# Patient Record
Sex: Male | Born: 1975
Health system: Southern US, Community
[De-identification: ages and names within clinical notes are randomized; demographics above are authoritative.]

## PROBLEM LIST (undated history)

## (undated) DIAGNOSIS — E119 Type 2 diabetes mellitus without complications: Secondary | ICD-10-CM

## (undated) DIAGNOSIS — I10 Essential (primary) hypertension: Secondary | ICD-10-CM

---

## 2004-03-29 ENCOUNTER — Emergency Department (HOSPITAL_COMMUNITY): Admission: EM | Admit: 2004-03-29 | Discharge: 2004-03-29 | Payer: Self-pay | Admitting: Emergency Medicine

## 2013-11-20 ENCOUNTER — Emergency Department (HOSPITAL_COMMUNITY): Payer: Self-pay

## 2013-11-20 ENCOUNTER — Encounter (HOSPITAL_COMMUNITY): Payer: Self-pay | Admitting: Emergency Medicine

## 2013-11-20 ENCOUNTER — Emergency Department (HOSPITAL_COMMUNITY)
Admission: EM | Admit: 2013-11-20 | Discharge: 2013-11-20 | Disposition: A | Discharge status: Home / Self Care | Payer: Self-pay | Attending: Emergency Medicine | Admitting: Emergency Medicine

## 2013-11-20 DIAGNOSIS — S21239A Puncture wound without foreign body of unspecified back wall of thorax without penetration into thoracic cavity, initial encounter: Secondary | ICD-10-CM

## 2013-11-20 DIAGNOSIS — R000 Tachycardia, unspecified: Secondary | ICD-10-CM | POA: Insufficient documentation

## 2013-11-20 DIAGNOSIS — Z23 Encounter for immunization: Secondary | ICD-10-CM | POA: Insufficient documentation

## 2013-11-20 DIAGNOSIS — E669 Obesity, unspecified: Secondary | ICD-10-CM | POA: Insufficient documentation

## 2013-11-20 DIAGNOSIS — S21209A Unspecified open wound of unspecified back wall of thorax without penetration into thoracic cavity, initial encounter: Secondary | ICD-10-CM | POA: Insufficient documentation

## 2013-11-20 DIAGNOSIS — Y9289 Other specified places as the place of occurrence of the external cause: Secondary | ICD-10-CM | POA: Insufficient documentation

## 2013-11-20 DIAGNOSIS — Y939 Activity, unspecified: Secondary | ICD-10-CM | POA: Insufficient documentation

## 2013-11-20 DIAGNOSIS — W3400XA Accidental discharge from unspecified firearms or gun, initial encounter: Secondary | ICD-10-CM | POA: Insufficient documentation

## 2013-11-20 LAB — CBC
HCT: 46.7 % (ref 39.0–52.0)
Hemoglobin: 15.8 g/dL (ref 13.0–17.0)
MCH: 28.6 pg (ref 26.0–34.0)
MCHC: 33.8 g/dL (ref 30.0–36.0)
MCV: 84.6 fL (ref 78.0–100.0)
Platelets: 183 10*3/uL (ref 150–400)
RBC: 5.52 MIL/uL (ref 4.22–5.81)
RDW: 13.6 % (ref 11.5–15.5)
WBC: 9.2 10*3/uL (ref 4.0–10.5)

## 2013-11-20 LAB — COMPREHENSIVE METABOLIC PANEL
ALT: 61 U/L — ABNORMAL HIGH (ref 0–53)
AST: 33 U/L (ref 0–37)
Albumin: 4.1 g/dL (ref 3.5–5.2)
Alkaline Phosphatase: 69 U/L (ref 39–117)
BUN: 17 mg/dL (ref 6–23)
CO2: 24 mEq/L (ref 19–32)
Calcium: 9.9 mg/dL (ref 8.4–10.5)
Chloride: 102 mEq/L (ref 96–112)
Creatinine, Ser: 1.2 mg/dL (ref 0.50–1.35)
GFR calc Af Amer: 89 mL/min — ABNORMAL LOW (ref >90)
GFR calc non Af Amer: 77 mL/min — ABNORMAL LOW (ref >90)
Glucose, Bld: 116 mg/dL — ABNORMAL HIGH (ref 70–99)
Potassium: 3.5 mEq/L — ABNORMAL LOW (ref 3.7–5.3)
Sodium: 138 mEq/L (ref 137–147)
Total Bilirubin: 0.2 mg/dL — ABNORMAL LOW (ref 0.3–1.2)
Total Protein: 8.1 g/dL (ref 6.0–8.3)

## 2013-11-20 LAB — SAMPLE TO BLOOD BANK

## 2013-11-20 LAB — PROTIME-INR
INR: 1.04 (ref 0.00–1.49)
Prothrombin Time: 13.4 seconds (ref 11.6–15.2)

## 2013-11-20 LAB — ETHANOL: Alcohol, Ethyl (B): <11 mg/dL (ref 0–11)

## 2013-11-20 MED ORDER — MORPHINE SULFATE 4 MG/ML IJ SOLN: 6.0000 mg | Freq: Once | INTRAMUSCULAR | Status: DC

## 2013-11-20 MED ORDER — IOHEXOL 300 MG/ML  SOLN
100.0000 mL | Freq: Once | INTRAMUSCULAR | Status: AC | PRN
  Administered 2013-11-20: 100 mL via INTRAVENOUS

## 2013-11-20 MED ORDER — SODIUM CHLORIDE 0.9 % IV SOLN
1000.0000 mL | INTRAVENOUS | Status: DC
  Administered 2013-11-20: 1000 mL via INTRAVENOUS

## 2013-11-20 MED ORDER — SODIUM CHLORIDE 0.9 % IV SOLN
1000.0000 mL | Freq: Once | INTRAVENOUS | Status: AC
  Administered 2013-11-20: 1000 mL via INTRAVENOUS

## 2013-11-20 MED ORDER — TETANUS-DIPHTH-ACELL PERTUSSIS 5-2.5-18.5 LF-MCG/0.5 IM SUSP
0.5000 mL | Freq: Once | INTRAMUSCULAR | Status: AC
  Administered 2013-11-20: 0.5 mL via INTRAMUSCULAR
  Filled 2013-11-20: qty 0.5

## 2013-11-20 MED ORDER — CEPHALEXIN 500 MG PO CAPS: 500.0000 mg | ORAL_CAPSULE | Freq: Three times a day (TID) | ORAL | Status: DC

## 2013-11-20 MED ORDER — MORPHINE SULFATE 4 MG/ML IJ SOLN
INTRAMUSCULAR | Status: AC
  Filled 2013-11-20: qty 1

## 2013-11-20 MED ORDER — MORPHINE SULFATE 2 MG/ML IJ SOLN
INTRAMUSCULAR | Status: AC
  Filled 2013-11-20: qty 1
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
## 2013-11-20 NOTE — ED Notes (Signed)
Pts clothing and belongings bagged in brown paper bags. [employer] to bedside.

## 2013-11-20 NOTE — ED Notes (Signed)
Patient reports that he is feeling better and that he wants to go home Dr. Jodi Mourning made aware

## 2013-11-20 NOTE — ED Notes (Addendum)
MD at bedside with suture cart. 

## 2013-11-20 NOTE — ED Notes (Signed)
Patient declined pain medication.
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
## 2013-11-20 NOTE — ED Notes (Signed)
Patient back from CT scan.
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
## 2013-11-20 NOTE — ED Notes (Signed)
X-ray at bedside

## 2013-11-20 NOTE — ED Notes (Signed)
Patient transported to CT via stretcher Patient in NAD upon leaving for testing 
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
## 2016-01-07 ENCOUNTER — Emergency Department (HOSPITAL_BASED_OUTPATIENT_CLINIC_OR_DEPARTMENT_OTHER)
Admission: EM | Admit: 2016-01-07 | Discharge: 2016-01-07 | Disposition: A | Discharge status: Home / Self Care | Payer: Self-pay | Attending: Emergency Medicine | Admitting: Emergency Medicine

## 2016-01-07 ENCOUNTER — Encounter (HOSPITAL_BASED_OUTPATIENT_CLINIC_OR_DEPARTMENT_OTHER): Payer: Self-pay | Admitting: *Deleted

## 2016-01-07 DIAGNOSIS — L02412 Cutaneous abscess of left axilla: Secondary | ICD-10-CM | POA: Insufficient documentation

## 2016-01-07 MED ORDER — SULFAMETHOXAZOLE-TRIMETHOPRIM 800-160 MG PO TABS: 1.0000 | ORAL_TABLET | Freq: Two times a day (BID) | ORAL | 0 refills | Status: DC

## 2016-01-07 MED ORDER — LIDOCAINE HCL (PF) 2 % IJ SOLN
INTRAMUSCULAR | Status: AC
  Filled 2016-01-07: qty 2

## 2016-01-07 MED ORDER — IBUPROFEN 800 MG PO TABS: 800.0000 mg | ORAL_TABLET | Freq: Three times a day (TID) | ORAL | 0 refills | Status: DC | PRN

## 2016-01-07 MED ORDER — LIDOCAINE HCL (PF) 2 % IJ SOLN: 0.0000 mL | Freq: Once | INTRAMUSCULAR | Status: DC | PRN

## 2016-01-07 MED ORDER — HYDROMORPHONE HCL 1 MG/ML IJ SOLN
2.0000 mg | Freq: Once | INTRAMUSCULAR | Status: AC
  Administered 2016-01-07: 2 mg via INTRAMUSCULAR
  Filled 2016-01-07: qty 2

## 2016-01-07 MED FILL — IBUPROFEN 800 MG TABLET: 800 | 7 days supply | Qty: 21 | Fill #0

## 2016-01-07 MED FILL — SULFAMETHOXAZOLE-TMP DS TAB: 800-160 | 7 days supply | Qty: 14 | Fill #0

## 2016-01-07 NOTE — ED Provider Notes (Signed)
[facility]   CSN: [id number] Arrival date & time: 01/07/16  8469  First Provider Contact:  First MD Initiated Contact with Patient 01/07/16 [phone]        History   Chief Complaint Chief Complaint  Patient presents with  . Abscess    HPI Henry Sanchez is a 40 y.o. male.  HPI   Patient presents with left axillary pain and swelling that began 1 week ago.  Has had abscess in this area that began similarly 1 year ago.  Has been using hot towels, witch hazel, fat back, and other home remedies without success.  Pain is 10/10 with raising arm, is described as tightness.  5/10 with arm lowered.  Denies fevers, chills, myalgias, CP, SOB, weakness or numbness of the left arm.  Per chart, last Tdap 2015.   History reviewed. No pertinent past medical history.  There are no active problems to display for this patient.   History reviewed. No pertinent surgical history.     Home Medications    Prior to Admission medications   Medication Sig Start Date End Date Taking? Authorizing Provider  cephALEXin (KEFLEX) 500 MG capsule Take 1 capsule (500 mg total) by mouth 3 (three) times daily. 11/20/13   Blane Ohara, MD    Family History History reviewed. No pertinent family history.  Social History Social History  Substance Use Topics  . Smoking status: Never Smoker  . Smokeless tobacco: Never Used  . Alcohol use Yes     Allergies   Review of patient's allergies indicates no known allergies.   Review of Systems Review of Systems  Constitutional: Negative for chills and fever.  Respiratory: Negative for shortness of breath.   Cardiovascular: Negative for chest pain.  Musculoskeletal: Negative for arthralgias and joint swelling.  Skin: Negative for color change.  Allergic/Immunologic: Negative for immunocompromised state.  Neurological: Negative for weakness and numbness.  Hematological: Does not bruise/bleed easily.  Psychiatric/Behavioral:  Negative for self-injury.     Physical Exam Updated Vital Signs BP (!) 142/102 (BP Location: Right Arm)   Pulse 107   Temp 98.5 F (36.9 C) (Oral)   Resp 20   Ht  (1.727 m)   Wt 106.6 kg   SpO2 100%   BMI 35.73 kg/m   Physical Exam  Constitutional: He appears well-developed and well-nourished. No distress.  HENT:  Head: Normocephalic and atraumatic.  Neck: Neck supple.  Pulmonary/Chest: Effort normal.  Musculoskeletal:  Left shoulder with normal ROM, limited only secondary to axillary skin pain   Neurological: He is alert.  Skin: He is not diaphoretic.  Left axilla with tenderness, swelling, fluctuance.  No overlying erythema.  No drainage.    Nursing note and vitals reviewed.    ED Treatments / Results  Labs (all labs ordered are listed, but only abnormal results are displayed) Labs Reviewed - No data to display  EKG  EKG Interpretation None       Radiology No results found.  Procedures Procedures (including critical care time)  Medications Ordered in ED Medications  HYDROmorphone (DILAUDID) injection 2 mg (not administered)  lidocaine (XYLOCAINE) 2 % injection 0-20 mL (not administered)     Initial Impression / Assessment and Plan / ED Course  I have reviewed the triage vital signs and the nursing notes.  Pertinent labs & imaging results that were available during my care of the patient were reviewed by me and considered in my medical decision making (see chart for details).  Clinical  Course    EMERGENCY DEPARTMENT US SOFT TISSUE INTERPRETATION "Study: Limited Ultrasound of the noted body part in comments below"  INDICATIONS: Pain Multiple views of the body part are obtained with a multi-frequency linear probe  PERFORMED BY:  Myself  IMAGES ARCHIVED?: Yes  SIDE:Left  BODY PART:Axilla  FINDINGS: Abcess present  LIMITATIONS:  Body Habitus and Emergent Procedure  INTERPRETATION:  Abcess present  COMMENT:  Left axilla with  organizing abscess.     INCISION AND DRAINAGE Performed by: Trixie Dredge Consent: Verbal consent obtained. Risks and benefits: risks, benefits and alternatives were discussed Type: abscess  Body area: left axilla  Anesthesia: local infiltration  Incision was made with a scalpel.  Local anesthetic: lidocaine 2% no epinephrine  Anesthetic total: 8 ml  Complexity: complex Blunt dissection to break up loculations  Drainage: purulent  Drainage amount: moderate   Packing material:none  Abscess cavity irrigated with normal saline.  Patient tolerance: Patient tolerated the procedure well with no immediate complications.     Afebrile, nontoxic patient with left axillary abscess with area of fluid collection and surrounding less organized abscess.  I&D in department with improvement.  Discussed home care with continued warm soaks and compresses, anticipated continued drainage.  Bactrim and motrin for home.  Return precautions given.   D/C home. Discussed result, findings, treatment, and follow up  with patient.  Pt given return precautions.  Pt verbalizes understanding and agrees with plan.       Final Clinical Impressions(s) / ED Diagnoses   Final diagnoses:  Abscess of axilla, left    New Prescriptions New Prescriptions   IBUPROFEN (ADVIL,MOTRIN) 800 MG TABLET    Take 1 tablet (800 mg total) by mouth every 8 (eight) hours as needed for mild pain or moderate pain.   SULFAMETHOXAZOLE-TRIMETHOPRIM (BACTRIM DS,SEPTRA DS) 800-160 MG TABLET    Take 1 tablet by mouth 2 (two) times daily. X 7 days     Trixie Dredge, PA-C 01/07/16 1053    Jacalyn Lefevre, MD 01/07/16 [phone]

## 2016-01-07 NOTE — Discharge Instructions (Signed)
Read the information below.  Use the prescribed medication as directed.  Please discuss all new medications with your pharmacist.  You may return to the Emergency Department at any time for worsening condition or any new symptoms that concern you.   Continue to use warm moist compresses on the area, several times daily, to encourage drainage.  Use dry guaze or bandage, do not use ointment while abscess continues to drain.   If you develop redness, swelling, worsening pain, or fevers greater than 100.4, return to the ER immediately for a recheck.

## 2016-01-07 NOTE — ED Notes (Signed)
?  abscess to left axillary area x 1 week. No relief with home remedies.

## 2016-01-07 NOTE — ED Notes (Signed)
PA at bedside.

## 2016-01-07 NOTE — ED Notes (Signed)
Pt given d/c instructions as per chart. Verbalizes understanding. No questions. Rx x 2. 

## 2016-01-07 NOTE — ED Notes (Signed)
PA at bedside to do I&D.

## 2017-08-31 DIAGNOSIS — K219 Gastro-esophageal reflux disease without esophagitis: Secondary | ICD-10-CM | POA: Insufficient documentation

## 2018-11-26 ENCOUNTER — Emergency Department (HOSPITAL_BASED_OUTPATIENT_CLINIC_OR_DEPARTMENT_OTHER)
Admission: EM | Admit: 2018-11-26 | Discharge: 2018-11-26 | Disposition: A | Discharge status: Home / Self Care | Payer: Self-pay | Attending: Emergency Medicine | Admitting: Emergency Medicine

## 2018-11-26 ENCOUNTER — Encounter (HOSPITAL_BASED_OUTPATIENT_CLINIC_OR_DEPARTMENT_OTHER): Payer: Self-pay | Admitting: Emergency Medicine

## 2018-11-26 ENCOUNTER — Other Ambulatory Visit: Payer: Self-pay

## 2018-11-26 DIAGNOSIS — G510 Bell's palsy: Secondary | ICD-10-CM | POA: Insufficient documentation

## 2018-11-26 LAB — CBG MONITORING, ED: Glucose-Capillary: 81 mg/dL (ref 70–99)

## 2018-11-26 MED ORDER — PREDNISONE 20 MG PO TABS: ORAL_TABLET | ORAL | 0 refills | Status: DC

## 2018-11-26 MED ORDER — PREDNISONE 10 MG PO TABS
ORAL_TABLET | ORAL | Status: AC
  Filled 2018-11-26: qty 1

## 2018-11-26 MED ORDER — PREDNISONE 50 MG PO TABS
60.0000 mg | ORAL_TABLET | Freq: Once | ORAL | Status: AC
  Administered 2018-11-26: 60 mg via ORAL
  Filled 2018-11-26: qty 1

## 2018-11-26 MED ORDER — ARTIFICIAL TEARS OPHTHALMIC OINT: TOPICAL_OINTMENT | Freq: Every day | OPHTHALMIC | 0 refills | Status: DC

## 2018-11-26 MED ORDER — VALACYCLOVIR HCL 500 MG PO TABS
1000.0000 mg | ORAL_TABLET | Freq: Once | ORAL | Status: AC
  Administered 2018-11-26: 1000 mg via ORAL
  Filled 2018-11-26: qty 2

## 2018-11-26 MED ORDER — VALACYCLOVIR HCL 1 G PO TABS: 1000.0000 mg | ORAL_TABLET | Freq: Three times a day (TID) | ORAL | 0 refills | Status: AC

## 2018-11-26 NOTE — Discharge Instructions (Signed)
If you develop a severe headache, fever, weakness or numbness in your arms or legs, or any other new/concerning symptoms then return to the ER for evaluation.

## 2018-11-26 NOTE — ED Provider Notes (Signed)
[facility] EMERGENCY DEPARTMENT Provider Note   CSN: [id number] Arrival date & time: 11/26/18  1739     History   Chief Complaint Chief Complaint  Patient presents with  . Facial Droop    HPI Henry Sanchez is a 43 y.o. male.     HPI  43 year old male presents with left-sided facial droop.  Started off about a week ago with left-sided tongue numbness.  Progressed to left facial numbness, facial droop.  Had a transient mild headache yesterday but no significant headache today or otherwise.  No fevers, blurry vision, weakness or numbness in his extremities.  No vomiting.  No recent illness.  He denies any medical problems.  No trouble speaking.  History reviewed. No pertinent past medical history.  There are no active problems to display for this patient.   History reviewed. No pertinent surgical history.      Home Medications    Prior to Admission medications   Medication Sig Start Date End Date Taking? Authorizing Provider  artificial tears (LACRILUBE) OINT ophthalmic ointment Place into both eyes at bedtime. 11/26/18   Pricilla LovelessGoldston, Jaaziah Schulke, MD  cephALEXin (KEFLEX) 500 MG capsule Take 1 capsule (500 mg total) by mouth 3 (three) times daily. 11/20/13   Blane OharaZavitz, Joshua, MD  ibuprofen (ADVIL,MOTRIN) 800 MG tablet Take 1 tablet (800 mg total) by mouth every 8 (eight) hours as needed for mild pain or moderate pain. 01/07/16   Trixie DredgeWest, Emily, PA-C  predniSONE (DELTASONE) 20 MG tablet 3 tabs po daily x 2 days, then 2 tabs x 3 days, then 1.5 tabs x 3 days, then 1 tab x 3 days, then 0.5 tabs x 3 days 11/27/18   Pricilla LovelessGoldston, Chi Garlow, MD  sulfamethoxazole-trimethoprim (BACTRIM DS,SEPTRA DS) 800-160 MG tablet Take 1 tablet by mouth 2 (two) times daily. X 7 days 01/07/16   West, Emily, PA-C  valACYclovir (VALTREX) 1000 MG tablet Take 1 tablet (1,000 mg total) by mouth 3 (three) times daily. 11/26/18   Pricilla LovelessGoldston, Jessel Gettinger, MD    Family History No family history on file.  Social History Social  History   Tobacco Use  . Smoking status: Never Smoker  . Smokeless tobacco: Never Used  Substance Use Topics  . Alcohol use: Yes  . Drug use: No     Allergies   Patient has no known allergies.   Review of Systems Review of Systems  Constitutional: Negative for fever.  Eyes: Negative for visual disturbance.  Gastrointestinal: Negative for vomiting.  Neurological: Positive for numbness and headaches (yesterday, none today). Negative for speech difficulty and weakness.  All other systems reviewed and are negative.    Physical Exam Updated Vital Signs BP (!) 144/103 (BP Location: Right Arm)   Pulse 100   Temp 98.1 F (36.7 C) (Oral)   Resp 18   Ht 5\' 8"  (1.727 m)   Wt 108.9 kg   SpO2 98%   BMI 36.49 kg/m   Physical Exam Vitals signs and nursing note reviewed.  Constitutional:      General: He is not in acute distress.    Appearance: He is well-developed. He is obese. He is not ill-appearing or diaphoretic.  HENT:     Head: Normocephalic and atraumatic.     Right Ear: Tympanic membrane, ear canal and external ear normal.     Left Ear: Tympanic membrane, ear canal and external ear normal.     Nose: Nose normal.  Eyes:     General:        Right  eye: No discharge.        Left eye: No discharge.     Extraocular Movements: Extraocular movements intact.     Pupils: Pupils are equal, round, and reactive to light.  Neck:     Musculoskeletal: Neck supple.  Cardiovascular:     Rate and Rhythm: Normal rate and regular rhythm.     Heart sounds: Normal heart sounds.  Pulmonary:     Effort: Pulmonary effort is normal.     Breath sounds: Normal breath sounds.  Abdominal:     General: There is no distension.  Skin:    General: Skin is warm and dry.  Neurological:     Mental Status: He is alert.     Comments: CN 3-12 grossly intact save for Bell's palsy. His left eyebrow rests a little inferior to right with decreased contractility of frontalis. Left eyelid does not stay  closed with testing. 5/5 strength in all 4 extremities. Grossly normal sensation. Normal finger to nose.   Psychiatric:        Mood and Affect: Mood is not anxious.      ED Treatments / Results  Labs (all labs ordered are listed, but only abnormal results are displayed) Labs Reviewed  CBG MONITORING, ED    EKG    Radiology No results found.  Procedures Procedures (including critical care time)  Medications Ordered in ED Medications  predniSONE (DELTASONE) tablet 60 mg (has no administration in time range)  valACYclovir (VALTREX) tablet 1,000 mg (has no administration in time range)     Initial Impression / Assessment and Plan / ED Course  I have reviewed the triage vital signs and the nursing notes.  Pertinent labs & imaging results that were available during my care of the patient were reviewed by me and considered in my medical decision making (see chart for details).        Presentation is consistent with Bell's palsy.  No alarming findings on exam or history.  Afebrile.  No significant headache that would be concerning for intracranial process.  He is presenting a little late but will still treat with prednisone and valacyclovir and Lacri-Lube.  Follow-up with ENT.  Discussed return precautions.  Final Clinical Impressions(s) / ED Diagnoses   Final diagnoses:  Left-sided Bell's palsy    ED Discharge Orders         Ordered    predniSONE (DELTASONE) 20 MG tablet     11/26/18 1809    valACYclovir (VALTREX) 1000 MG tablet  3 times daily     11/26/18 1809    artificial tears (LACRILUBE) OINT ophthalmic ointment  Daily at bedtime     11/26/18 1809           Sherwood Gambler, MD 11/26/18 1816

## 2018-11-26 NOTE — ED Triage Notes (Signed)
L side facial droop and numbness since Tuesday. No weakness in arms or legs.

## 2018-11-26 NOTE — ED Notes (Signed)
ED Provider at bedside. 

## 2018-12-06 DIAGNOSIS — G510 Bell's palsy: Secondary | ICD-10-CM | POA: Insufficient documentation

## 2019-05-22 ENCOUNTER — Emergency Department (HOSPITAL_BASED_OUTPATIENT_CLINIC_OR_DEPARTMENT_OTHER)
Admission: EM | Admit: 2019-05-22 | Discharge: 2019-05-22 | Disposition: A | Discharge status: Home / Self Care | Payer: Self-pay | Attending: Emergency Medicine | Admitting: Emergency Medicine

## 2019-05-22 ENCOUNTER — Other Ambulatory Visit: Payer: Self-pay

## 2019-05-22 ENCOUNTER — Encounter (HOSPITAL_BASED_OUTPATIENT_CLINIC_OR_DEPARTMENT_OTHER): Payer: Self-pay | Admitting: *Deleted

## 2019-05-22 DIAGNOSIS — B349 Viral infection, unspecified: Secondary | ICD-10-CM

## 2019-05-22 DIAGNOSIS — U071 COVID-19: Secondary | ICD-10-CM | POA: Insufficient documentation

## 2019-05-22 LAB — INFLUENZA PANEL BY PCR (TYPE A & B)
Influenza A By PCR: NEGATIVE
Influenza B By PCR: NEGATIVE

## 2019-05-22 MED ORDER — IBUPROFEN 800 MG PO TABS
800.0000 mg | ORAL_TABLET | Freq: Once | ORAL | Status: AC
  Administered 2019-05-22: 800 mg via ORAL
  Filled 2019-05-22: qty 1

## 2019-05-22 NOTE — Discharge Instructions (Addendum)
You may alternate Tylenol 1000 mg every 6 hours as needed for fever and pain and ibuprofen 800 mg every 8 hours as needed for fever and pain. Please rest and drink plenty of fluids. This is a viral illness causing your symptoms. You do not need antibiotics for a virus. You may use over-the-counter nasal saline spray and Afrin nasal saline spray as needed for nasal congestion. Please do not use Afrin for more than 3 days in a row. You may use guaifenesin and dextromethorphan as needed for cough.  You may use lozenges and Chloraseptic spray to help with sore throat.  Warm salt water gargles can also help with sore throat.  You may use over-the-counter Unisom (doxyalamine) or Benadryl to help with sleep.  Please note that some combination medicines such as DayQuil and NyQuil have multiple medications in them.  Please make sure you look at all labels to ensure that you are not taking too much of any one particular medication.  Symptoms from a virus may take 7-14 days to run its course.  The flu is treated like any other virus with supportive measures as listed above. At this time you are outside the treatment window for Xofluza or Tamiflu.  These medications have to be taken within the first 48 hours of symptoms.     Please quarantine for 10 days after the onset of symptoms.  You will need to be fever free without using Tylenol or Ibuprofen for 3 full days AND your symptoms will need to be significantly improving or resolved (other than the loss of taste and smell which can last up to 3 months) before you can come out of quarantine.  You should isolate from others that you live with as well.  Any close contacts that have seen you in the past 14 days before your symptoms have started will need to be notified and they will need to quarantine for 14 full days even if they have a negative COVID test.  COVID-19 is a viral illness and currently there are no specific outpatient treatments other than supportive  measures such as alternating Tylenol and Ibuprofen, rest, increased fluid intake.  You do not need antibiotics.  If you develop chest pain, difficulty breathing, have blue lips or fingertips, begin vomiting and can not stop and can not hold down fluids, feel like you may pass out or you do pass out, have confusion, please return to the emergency department.   Steps to find a Primary Care Provider (PCP):  Call [phone] or [phone] to access "[facility]."  2.  You may also go on the [facility] website at [email or web address]  3.  [facility] also frequently accepts new patients.  [place] [phone]  4.  There are also multiple [facility], [facility] and [facility] practices throughout the [place] that are frequently accepting new patients. You may find a clinic that is close to your home and contact them.  [email or web address] [phone]  [facility] [email or web address]  [email or web address] [place] [email or web address] [phone]  5.  Local Health Departments also can provide primary care services.  [facility]  [place] [address] [phone]  [facility] [place] [address] [place] [facility] [place] [phone]

## 2019-05-22 NOTE — ED Provider Notes (Signed)
TIME SEEN: 1:09 AM  CHIEF COMPLAINT: Fever, chills, decreased energy, body aches  HPI: Patient is a 43 year old male with no significant past medical history who presents to the emergency department with symptoms of fevers, chills, decreased energy and body aches that started on December 5.  No known Covid exposures.  Reports he gets tested for Covid every week and was last tested on the fourth and was negative.  Denies headache, neck pain or neck stiffness, ear pain or sore throat, loss of taste or smell, chest pain or shortness of breath, cough, abdominal pain, nausea or vomiting, diarrhea, dysuria or hematuria, rash.  Did not have an influenza vaccination this year.  Did not take any medication prior to arrival.  ROS: See HPI Constitutional:  fever  Eyes: no drainage  ENT: no runny nose   Cardiovascular:  no chest pain  Resp: no SOB  GI: no vomiting GU: no dysuria Integumentary: no rash  Allergy: no hives  Musculoskeletal: no leg swelling  Neurological: no slurred speech ROS otherwise negative  PAST MEDICAL HISTORY/PAST SURGICAL HISTORY:  History reviewed. No pertinent past medical history.  MEDICATIONS:  Prior to Admission medications   Medication Sig Start Date End Date Taking? Authorizing Provider  artificial tears (LACRILUBE) OINT ophthalmic ointment Place into both eyes at bedtime. 11/26/18   Sherwood Gambler, MD  cephALEXin (KEFLEX) 500 MG capsule Take 1 capsule (500 mg total) by mouth 3 (three) times daily. 11/20/13   Elnora Morrison, MD  ibuprofen (ADVIL,MOTRIN) 800 MG tablet Take 1 tablet (800 mg total) by mouth every 8 (eight) hours as needed for mild pain or moderate pain. 01/07/16   Clayton Bibles, PA-C  predniSONE (DELTASONE) 20 MG tablet 3 tabs po daily x 2 days, then 2 tabs x 3 days, then 1.5 tabs x 3 days, then 1 tab x 3 days, then 0.5 tabs x 3 days 11/27/18   Sherwood Gambler, MD  sulfamethoxazole-trimethoprim (BACTRIM DS,SEPTRA DS) 800-160 MG tablet Take 1 tablet by mouth 2  (two) times daily. X 7 days 01/07/16   West, Emily, PA-C  valACYclovir (VALTREX) 1000 MG tablet Take 1 tablet (1,000 mg total) by mouth 3 (three) times daily. 11/26/18   Sherwood Gambler, MD    ALLERGIES:  No Known Allergies  SOCIAL HISTORY:  Social History   Tobacco Use  . Smoking status: Never Smoker  . Smokeless tobacco: Never Used  Substance Use Topics  . Alcohol use: Yes    FAMILY HISTORY: History reviewed. No pertinent family history.  EXAM: BP (!) 136/98   Pulse (!) 118   Temp 99.1 F (37.3 C) (Oral)   Resp 20   Ht 5\' 8"  (1.727 m)   Wt 108.9 kg   SpO2 97%   BMI 36.49 kg/m  CONSTITUTIONAL: Alert and oriented and responds appropriately to questions. Well-appearing; well-nourished, appears well-hydrated, nontoxic-appearing HEAD: Normocephalic EYES: Conjunctivae clear, pupils appear equal, EOM appear intact ENT: normal nose; moist mucous membranes NECK: Supple, normal ROM CARD: Regular and tachycardic; S1 and S2 appreciated; no murmurs, no clicks, no rubs, no gallops RESP: Normal chest excursion without splinting or tachypnea; breath sounds clear and equal bilaterally; no wheezes, no rhonchi, no rales, no hypoxia or respiratory distress, speaking full sentences ABD/GI: Normal bowel sounds; non-distended; soft, non-tender, no rebound, no guarding, no peritoneal signs, no hepatosplenomegaly BACK:  The back appears normal EXT: Normal ROM in all joints; no deformity noted, no edema; no cyanosis SKIN: Normal color for age and race; warm; no rash on exposed skin  NEURO: Moves all extremities equally PSYCH: The patient's mood and manner are appropriate.   MEDICAL DECISION MAKING: Patient here with symptoms of a viral syndrome.  He is tachycardic here likely from low-grade fever.  Will give ibuprofen and reassess.  Does not appear septic, toxic in the ED.  No specific symptoms to suggest bacterial infection.  He is requesting repeat Covid testing.  Last tested on the fourth before  he became symptomatic and was negative.  We will also test for influenza.  He is outside treatment window for flu.  I recommended supportive care instructions and outpatient follow-up as needed.  Discussed return precautions.  ED PROGRESS: Heart rate improving as fever coming down.  I feel he is safe to be discharged home.   At this time, I do not feel there is any life-threatening condition present. I have reviewed, interpreted and discussed all results (EKG, imaging, lab, urine as appropriate) and exam findings with patient/family. I have reviewed nursing notes and appropriate previous records.  I feel the patient is safe to be discharged home without further emergent workup and can continue workup as an outpatient as needed. Discussed usual and customary return precautions. Patient/family verbalize understanding and are comfortable with this plan.  Outpatient follow-up has been provided as needed. All questions have been answered.      Henry Sanchez was evaluated in Emergency Department on 05/22/2019 for the symptoms described in the history of present illness. He was evaluated in the context of the global COVID-19 pandemic, which necessitated consideration that the patient might be at risk for infection with the SARS-CoV-2 virus that causes COVID-19. Institutional protocols and algorithms that pertain to the evaluation of patients at risk for COVID-19 are in a state of rapid change based on information released by regulatory bodies including the CDC and federal and state organizations. These policies and algorithms were followed during the patient's care in the ED.  Patient was seen wearing N95, face shield, gloves, gown.    Henry Sanchez, [name], DO 05/22/19 [phone]

## 2019-05-22 NOTE — ED Triage Notes (Addendum)
Pt c/o fever , cough , body aches, h/a x 3 days, neg covid x 3 days ago at work

## 2019-05-23 LAB — NOVEL CORONAVIRUS, NAA (HOSP ORDER, SEND-OUT TO REF LAB; TAT 18-24 HRS): SARS-CoV-2, NAA: DETECTED — AB

## 2019-05-24 ENCOUNTER — Telehealth (HOSPITAL_COMMUNITY): Payer: Self-pay

## 2019-05-25 ENCOUNTER — Telehealth: Payer: Self-pay | Admitting: Nurse Practitioner

## 2019-05-25 NOTE — Telephone Encounter (Signed)
Called to Discuss with patient about Covid symptoms and the use of bamlanivimab, a monoclonal antibody infusion for those with mild to moderate Covid symptoms and at a high risk of hospitalization.     Pt is qualified for this infusion at the [facility] infusion center due to co-morbid conditions and/or a member of an at-risk group.  BMI is 36.49.     Unable to reach pt

## 2019-06-02 ENCOUNTER — Other Ambulatory Visit: Payer: Self-pay

## 2019-06-02 ENCOUNTER — Encounter (HOSPITAL_BASED_OUTPATIENT_CLINIC_OR_DEPARTMENT_OTHER): Payer: Self-pay

## 2019-06-02 ENCOUNTER — Emergency Department (HOSPITAL_BASED_OUTPATIENT_CLINIC_OR_DEPARTMENT_OTHER)
Admission: EM | Admit: 2019-06-02 | Discharge: 2019-06-02 | Disposition: A | Discharge status: Home / Self Care | Payer: Self-pay | Attending: Emergency Medicine | Admitting: Emergency Medicine

## 2019-06-02 DIAGNOSIS — L02411 Cutaneous abscess of right axilla: Secondary | ICD-10-CM | POA: Insufficient documentation

## 2019-06-02 MED ORDER — ONDANSETRON HCL 4 MG/2ML IJ SOLN: 4.0000 mg | Freq: Once | INTRAMUSCULAR | Status: DC

## 2019-06-02 MED ORDER — DOXYCYCLINE HYCLATE 100 MG PO CAPS: 100.0000 mg | ORAL_CAPSULE | Freq: Two times a day (BID) | ORAL | 0 refills | Status: AC

## 2019-06-02 MED ORDER — LIDOCAINE-EPINEPHRINE (PF) 2 %-1:200000 IJ SOLN
10.0000 mL | Freq: Once | INTRAMUSCULAR | Status: AC
  Administered 2019-06-02: 10 mL
  Filled 2019-06-02: qty 10

## 2019-06-02 NOTE — ED Triage Notes (Signed)
Pt with abcess under right arm, 3cm raised area, painful.  Pt denies any drainage to area. Pt currently in quarantine for +covid test on 12/9, was cleared to return to work on Tuesday.

## 2019-06-02 NOTE — ED Provider Notes (Signed)
[facility] EMERGENCY DEPARTMENT Provider Note   CSN: [id number] Arrival date & time: 06/02/19  5956     History Chief Complaint  Patient presents with  . Abscess    Henry Sanchez is a 43 y.o. male with no significant PMH who presents with abscess under his left arm x 1-2 days. Became very painful since yesterday. Has a history of abscesses under his arms but has not had any in quite a while. Denies any fever/chills. Denies any drainage from the area.   History reviewed. No pertinent past medical history.  There are no problems to display for this patient.  History reviewed. No pertinent surgical history.   History reviewed. No pertinent family history.  Social History   Tobacco Use  . Smoking status: Never Smoker  . Smokeless tobacco: Never Used  Substance Use Topics  . Alcohol use: Yes  . Drug use: No    Home Medications Prior to Admission medications   Medication Sig Start Date End Date Taking? Authorizing Provider  cefdinir (OMNICEF) 300 MG capsule Take by mouth. 05/24/19 06/03/19 Yes [provider]  doxycycline (VIBRAMYCIN) 100 MG capsule Take 1 capsule (100 mg total) by mouth 2 (two) times daily for 7 days. 06/02/19 06/09/19  Lilybelle Mayeda P, DO  valACYclovir (VALTREX) 1000 MG tablet Take 1 tablet (1,000 mg total) by mouth 3 (three) times daily. 11/26/18   Pricilla Loveless, MD    Allergies    Patient has no known allergies.  Review of Systems   Review of Systems  Constitutional: Negative for chills and fever.  HENT: Negative for sore throat.   Respiratory: Negative for cough and shortness of breath.   Cardiovascular: Negative for chest pain.  Gastrointestinal: Negative for abdominal pain, constipation, diarrhea, nausea and vomiting.  Skin: Positive for wound.    Physical Exam Updated Vital Signs BP (!) 133/94 (BP Location: Left Arm)   Pulse (!) 115   Temp 97.9 F (36.6 C) (Oral)   Resp 20   Ht 5\' 8"  (1.727 m)   Wt 108.9 kg    SpO2 97%   BMI 36.49 kg/m   Physical Exam Constitutional:      Appearance: Normal appearance. He is obese.  HENT:     Head: Normocephalic and atraumatic.  Eyes:     Conjunctiva/sclera: Conjunctivae normal.  Pulmonary:     Effort: Pulmonary effort is normal.  Skin:    General: Skin is warm and dry.     Capillary Refill: Capillary refill takes less than 2 seconds.     Findings: Abscess present.       Neurological:     Mental Status: He is alert.     ED Results / Procedures / Treatments   Labs (all labs ordered are listed, but only abnormal results are displayed) Labs Reviewed - No data to display  EKG None  Radiology No results found.  Procedures . Incision and Drainage  Date/Time: 06/02/2019 10:02 AM Performed by: 06/04/2019, DO Authorized by: Joana Reamer, MD   Consent:    Consent obtained:  Verbal   Consent given by:  Patient   Risks discussed:  Bleeding, incomplete drainage, pain and infection Location:    Type:  Abscess   Location: right axilla. Pre-procedure details:    Skin preparation:  Betadine Anesthesia (see MAR for exact dosages):    Anesthesia method:  Local infiltration   Local anesthetic:  Lidocaine 2% WITH epi Procedure type:    Complexity:  Simple Procedure details:  Needle aspiration: no     Incision types:  Single straight   Incision depth:  Dermal   Scalpel blade:  11   Wound management:  Irrigated with saline and probed and deloculated   Drainage:  Bloody and purulent   Drainage amount:  Copious   Wound treatment:  Wound left open   Packing materials:  1/2 in iodoform gauze Post-procedure details:    Patient tolerance of procedure:  Tolerated well, no immediate complications   (including critical care time)  Medications Ordered in ED Medications  lidocaine-EPINEPHrine (XYLOCAINE W/EPI) 2 %-1:200000 (PF) injection 10 mL (10 mLs Infiltration Given by Other 06/02/19 9379)    ED Course  I have reviewed the  triage vital signs and the nursing notes.  Pertinent labs & imaging results that were available during my care of the patient were reviewed by me and considered in my medical decision making (see chart for details).   43 yo male presenting with 1 day of worsening pain at right axilla. Patient is overall well appearing with symptoms consistent with axillary abscess.  Exam notable for 6x3cm fluctuant abscess without signs of drainage. No significant warmth or erythema. No signs of systemic symptoms. Patient to undergo incision and drainage.   I&D performed in usual fashion without complication. See above procedure note for details. Copious drainage obtained consisting of pus and blood. Pain improved post procedure. Wound care discussed and supplies given. 7 day course of Doxycycline 100mg  BID given. Instructed to call to schedule appointment with surgery to discuss definitive treatment. Contact information provided.   Return precautions discussed with family prior to discharge and they were advised to follow with pcp as needed if symptoms worsen or fail to improve.    MDM Rules/Calculators/A&P                      Final Clinical Impression(s) / ED Diagnoses Final diagnoses:  Abscess of axilla, right    Rx / DC Orders ED Discharge Orders         Ordered    doxycycline (VIBRAMYCIN) 100 MG capsule  2 times daily     06/02/19 [address], [place], DO 06/02/19 1004    Quintella Reichert, MD 06/03/19 [phone]

## 2019-06-02 NOTE — Discharge Instructions (Addendum)
Please take medications as prescribed.  Your abscess was lanced today, and has a small wick to help keep the skin open to allow further drainage.  This may be removed in two days, shower and soak the area prior to pulling out.  Return to your doctor, urgent care, or the ER for for worsening pain, fever, redness or persistent bleeding.  You may shower, but do not scrub the area, allow the water to run over the area.  Keep area covered with bandage after bathing.  If you were given antibiotics, take all the pills, do not stop just because your redness or other symptoms are improving!

## 2020-09-24 ENCOUNTER — Emergency Department (HOSPITAL_BASED_OUTPATIENT_CLINIC_OR_DEPARTMENT_OTHER)
Admission: EM | Admit: 2020-09-24 | Discharge: 2020-09-24 | Disposition: A | Discharge status: Home / Self Care | Payer: Managed Care, Other (non HMO) | Attending: Emergency Medicine | Admitting: Emergency Medicine

## 2020-09-24 ENCOUNTER — Other Ambulatory Visit: Payer: Self-pay

## 2020-09-24 ENCOUNTER — Encounter (HOSPITAL_BASED_OUTPATIENT_CLINIC_OR_DEPARTMENT_OTHER): Payer: Self-pay | Admitting: *Deleted

## 2020-09-24 ENCOUNTER — Emergency Department (HOSPITAL_BASED_OUTPATIENT_CLINIC_OR_DEPARTMENT_OTHER): Payer: Managed Care, Other (non HMO)

## 2020-09-24 DIAGNOSIS — K529 Noninfective gastroenteritis and colitis, unspecified: Secondary | ICD-10-CM

## 2020-09-24 DIAGNOSIS — K760 Fatty (change of) liver, not elsewhere classified: Secondary | ICD-10-CM | POA: Diagnosis not present

## 2020-09-24 DIAGNOSIS — R1011 Right upper quadrant pain: Secondary | ICD-10-CM

## 2020-09-24 DIAGNOSIS — R112 Nausea with vomiting, unspecified: Secondary | ICD-10-CM | POA: Diagnosis present

## 2020-09-24 LAB — COMPREHENSIVE METABOLIC PANEL
ALT: 96 U/L — ABNORMAL HIGH (ref 0–44)
AST: 75 U/L — ABNORMAL HIGH (ref 15–41)
Albumin: 4.2 g/dL (ref 3.5–5.0)
Alkaline Phosphatase: 65 U/L (ref 38–126)
Anion gap: 9 (ref 5–15)
BUN: 18 mg/dL (ref 6–20)
CO2: 24 mmol/L (ref 22–32)
Calcium: 9 mg/dL (ref 8.9–10.3)
Chloride: 103 mmol/L (ref 98–111)
Creatinine, Ser: 1 mg/dL (ref 0.61–1.24)
GFR, Estimated: >60 mL/min (ref >60)
Glucose, Bld: 121 mg/dL — ABNORMAL HIGH (ref 70–99)
Potassium: 3.7 mmol/L (ref 3.5–5.1)
Sodium: 136 mmol/L (ref 135–145)
Total Bilirubin: 0.6 mg/dL (ref 0.3–1.2)
Total Protein: 8.2 g/dL — ABNORMAL HIGH (ref 6.5–8.1)

## 2020-09-24 LAB — CBC WITH DIFFERENTIAL/PLATELET
Abs Immature Granulocytes: 0.03 10*3/uL (ref 0.00–0.07)
Basophils Absolute: 0 10*3/uL (ref 0.0–0.1)
Basophils Relative: 0 %
Eosinophils Absolute: 0 10*3/uL (ref 0.0–0.5)
Eosinophils Relative: 0 %
HCT: 51.9 % (ref 39.0–52.0)
Hemoglobin: 17.1 g/dL — ABNORMAL HIGH (ref 13.0–17.0)
Immature Granulocytes: 1 %
Lymphocytes Relative: 25 %
Lymphs Abs: 1.5 10*3/uL (ref 0.7–4.0)
MCH: 28.5 pg (ref 26.0–34.0)
MCHC: 32.9 g/dL (ref 30.0–36.0)
MCV: 86.5 fL (ref 80.0–100.0)
Monocytes Absolute: 0.3 10*3/uL (ref 0.1–1.0)
Monocytes Relative: 5 %
Neutro Abs: 4.3 10*3/uL (ref 1.7–7.7)
Neutrophils Relative %: 69 %
Platelets: 229 10*3/uL (ref 150–400)
RBC: 6 MIL/uL — ABNORMAL HIGH (ref 4.22–5.81)
RDW: 13.9 % (ref 11.5–15.5)
WBC: 6.2 10*3/uL (ref 4.0–10.5)
nRBC: 0 % (ref 0.0–0.2)

## 2020-09-24 LAB — URINALYSIS, ROUTINE W REFLEX MICROSCOPIC
Bilirubin Urine: NEGATIVE
Glucose, UA: NEGATIVE mg/dL
Hgb urine dipstick: NEGATIVE
Ketones, ur: NEGATIVE mg/dL
Leukocytes,Ua: NEGATIVE
Nitrite: NEGATIVE
Protein, ur: NEGATIVE mg/dL
Specific Gravity, Urine: 1.025 (ref 1.005–1.030)
pH: 6 (ref 5.0–8.0)

## 2020-09-24 LAB — LIPASE, BLOOD: Lipase: 34 U/L (ref 11–51)

## 2020-09-24 MED ORDER — ONDANSETRON HCL 4 MG PO TABS: 4.0000 mg | ORAL_TABLET | Freq: Four times a day (QID) | ORAL | 0 refills | Status: DC

## 2020-09-24 MED ORDER — SODIUM CHLORIDE 0.9 % IV BOLUS
1000.0000 mL | Freq: Once | INTRAVENOUS | Status: AC
  Administered 2020-09-24: 1000 mL via INTRAVENOUS

## 2020-09-24 MED ORDER — LIDOCAINE VISCOUS HCL 2 % MT SOLN
15.0000 mL | Freq: Once | OROMUCOSAL | Status: AC
  Administered 2020-09-24: 15 mL via ORAL
  Filled 2020-09-24: qty 15

## 2020-09-24 MED ORDER — ALUM & MAG HYDROXIDE-SIMETH 200-200-20 MG/5ML PO SUSP
30.0000 mL | Freq: Once | ORAL | Status: AC
  Administered 2020-09-24: 30 mL via ORAL
  Filled 2020-09-24: qty 30

## 2020-09-24 MED ORDER — ONDANSETRON HCL 4 MG/2ML IJ SOLN
4.0000 mg | Freq: Once | INTRAMUSCULAR | Status: AC
  Administered 2020-09-24: 4 mg via INTRAVENOUS
  Filled 2020-09-24: qty 2

## 2020-09-24 NOTE — ED Triage Notes (Signed)
C/o n/v/d x 1 day  

## 2020-09-24 NOTE — Discharge Instructions (Addendum)
You were given a prescription for zofran to help with your nausea. Please take as directed.  Please follow up with your primary doctor within the next 5-7 days.  If you do not have a primary care provider, information for a healthcare clinic has been provided for you to make arrangements for follow up care. Please return to the ER sooner if you have any new or worsening symptoms, or if you have any of the following symptoms:  Abdominal pain that does not go away.  You have a fever.  You keep throwing up (vomiting).  The pain is felt only in portions of the abdomen. Pain in the right side could possibly be appendicitis. In an adult, pain in the left lower portion of the abdomen could be colitis or diverticulitis.  You pass bloody or black tarry stools.  There is bright red blood in the stool.  The constipation stays for more than 4 days.  There is belly (abdominal) or rectal pain.  You do not seem to be getting better.  You have any questions or concerns.   

## 2020-09-24 NOTE — ED Provider Notes (Signed)
[facility] EMERGENCY DEPARTMENT Provider Note   CSN: [id number] Arrival date & time: 09/24/20  1642     History Chief Complaint  Patient presents with  . Vomiting    Henry Sanchez is a 45 y.o. male.  HPI   45 year old male presenting the emergency department today for evaluation of nausea vomiting and diarrhea that started earlier today.  Has had multiple episodes he came here for further evaluation.  He reports some mild epigastric abdominal pain that started after his episodes of vomiting.  He denies any fevers, chills, sweats or other associated symptoms.  Denies any sick contacts or eating any recent suspect foods.  History reviewed. No pertinent past medical history.  There are no problems to display for this patient.   History reviewed. No pertinent surgical history.     No family history on file.  Social History   Tobacco Use  . Smoking status: Never Smoker  . Smokeless tobacco: Never Used  Vaping Use  . Vaping Use: Never used  Substance Use Topics  . Alcohol use: Yes  . Drug use: No    Home Medications Prior to Admission medications   Medication Sig Start Date End Date Taking? Authorizing Provider  ondansetron (ZOFRAN) 4 MG tablet Take 1 tablet (4 mg total) by mouth every 6 (six) hours. 09/24/20  Yes Macdonald Rigor S, PA-C  valACYclovir (VALTREX) 1000 MG tablet Take 1 tablet (1,000 mg total) by mouth 3 (three) times daily. 11/26/18   Pricilla Loveless, MD    Allergies    Patient has no known allergies.  Review of Systems   Review of Systems  Constitutional: Negative for fever.  HENT: Negative for ear pain and sore throat.   Eyes: Negative for visual disturbance.  Respiratory: Negative for cough and shortness of breath.   Cardiovascular: Negative for chest pain.  Gastrointestinal: Positive for abdominal pain, diarrhea, nausea and vomiting.  Genitourinary: Negative for dysuria and hematuria.  Musculoskeletal: Negative for back pain.   Skin: Negative for rash.  Neurological: Negative for seizures and syncope.  All other systems reviewed and are negative.   Physical Exam Updated Vital Signs BP (!) 141/90 (BP Location: Left Arm)   Pulse (!) 114   Temp (!) 97.5 F (36.4 C) (Oral)   Resp 18   Ht 5\' 9"  (1.753 m)   Wt 108.9 kg   SpO2 99%   BMI 35.44 kg/m   Physical Exam Vitals and nursing note reviewed.  Constitutional:      Appearance: He is well-developed.  HENT:     Head: Normocephalic and atraumatic.  Eyes:     Conjunctiva/sclera: Conjunctivae normal.  Cardiovascular:     Rate and Rhythm: Normal rate and regular rhythm.     Heart sounds: Normal heart sounds. No murmur heard.   Pulmonary:     Effort: Pulmonary effort is normal. No respiratory distress.     Breath sounds: Normal breath sounds. No wheezing, rhonchi or rales.  Abdominal:     General: Bowel sounds are normal.     Palpations: Abdomen is soft.     Tenderness: There is no abdominal tenderness. There is no guarding or rebound.  Musculoskeletal:     Cervical back: Neck supple.  Skin:    General: Skin is warm and dry.  Neurological:     Mental Status: He is alert.     ED Results / Procedures / Treatments   Labs (all labs ordered are listed, but only abnormal results are displayed) Labs Reviewed  COMPREHENSIVE METABOLIC PANEL - Abnormal; Notable for the following components:      Result Value   Glucose, Bld 121 (*)    Total Protein 8.2 (*)    AST 75 (*)    ALT 96 (*)    All other components within normal limits  CBC WITH DIFFERENTIAL/PLATELET - Abnormal; Notable for the following components:   RBC 6.00 (*)    Hemoglobin 17.1 (*)    All other components within normal limits  LIPASE, BLOOD  URINALYSIS, ROUTINE W REFLEX MICROSCOPIC  HEPATITIS PANEL, ACUTE    EKG None  Radiology US Abdomen Limited RUQ (LIVER/GB)  Result Date: 09/24/2020 CLINICAL DATA:  Right upper quadrant abdominal pain. EXAM: ULTRASOUND ABDOMEN LIMITED  RIGHT UPPER QUADRANT COMPARISON:  May 24, 2019. FINDINGS: Gallbladder: No gallstones or wall thickening visualized. No sonographic Murphy sign noted by sonographer. Common bile duct: Diameter: 4 mm which is within normal limits. Liver: No focal lesion identified. Increased echogenicity of hepatic parenchyma is noted suggesting hepatic steatosis. Portal vein is patent on color Doppler imaging with normal direction of blood flow towards the liver. Other: None. IMPRESSION: Probable hepatic steatosis. No other abnormality seen in the right upper quadrant of the abdomen. Electronically Signed   By: Lupita Raider M.D.   On: 09/24/2020 18:38    Procedures Procedures   Medications Ordered in ED Medications  sodium chloride 0.9 % bolus 1,000 mL (0 mLs Intravenous Stopped 09/24/20 1905)  ondansetron (ZOFRAN) injection 4 mg (4 mg Intravenous Given 09/24/20 1719)  alum & mag hydroxide-simeth (MAALOX/MYLANTA) 200-200-20 MG/5ML suspension 30 mL (30 mLs Oral Given 09/24/20 1813)    And  lidocaine (XYLOCAINE) 2 % viscous mouth solution 15 mL (15 mLs Oral Given 09/24/20 1813)  sodium chloride 0.9 % bolus 1,000 mL (1,000 mLs Intravenous New Bag/Given 09/24/20 1907)    ED Course  I have reviewed the triage vital signs and the nursing notes.  Pertinent labs & imaging results that were available during my care of the patient were reviewed by me and considered in my medical decision making (see chart for details).    MDM Rules/Calculators/A&P                          Patient with symptoms consistent with viral gastroenteritis.  Vitals initially showed elevated heart rate however heart rate improved after IV fluids (on last recheck HR 105 which appears consistent with pts prior HR's on chart review) and patient feeling better, no fever.  Patient tolerating PO fluids > 6 oz.  Lungs are clear.  No focal abdominal pain, no concern for appendicitis, cholecystitis, pancreatitis, ruptured viscus, UTI, kidney stone, or  any other abdominal etiology.  Supportive therapy indicated with return if symptoms worsen.  Patient counseled.   Final Clinical Impression(s) / ED Diagnoses Final diagnoses:  RUQ pain  Gastroenteritis  Fatty liver    Rx / DC Orders ED Discharge Orders         Ordered    ondansetron (ZOFRAN) 4 MG tablet  Every 6 hours        09/24/20 1955           Karrie Meres, PA-C 09/24/20 1956    Charlynne Pander, MD 09/24/20 2107

## 2020-09-25 LAB — HEPATITIS PANEL, ACUTE
HCV Ab: NONREACTIVE
Hep A IgM: NONREACTIVE
Hep B C IgM: NONREACTIVE
Hepatitis B Surface Ag: NONREACTIVE

## 2020-12-25 ENCOUNTER — Ambulatory Visit: Payer: Managed Care, Other (non HMO) | Admitting: Family Medicine

## 2021-02-08 ENCOUNTER — Emergency Department (HOSPITAL_BASED_OUTPATIENT_CLINIC_OR_DEPARTMENT_OTHER)
Admission: EM | Admit: 2021-02-08 | Discharge: 2021-02-08 | Disposition: A | Discharge status: Home / Self Care | Payer: Managed Care, Other (non HMO) | Attending: Emergency Medicine | Admitting: Emergency Medicine

## 2021-02-08 ENCOUNTER — Encounter (HOSPITAL_BASED_OUTPATIENT_CLINIC_OR_DEPARTMENT_OTHER): Payer: Self-pay | Admitting: *Deleted

## 2021-02-08 ENCOUNTER — Other Ambulatory Visit: Payer: Self-pay

## 2021-02-08 DIAGNOSIS — K029 Dental caries, unspecified: Secondary | ICD-10-CM | POA: Insufficient documentation

## 2021-02-08 DIAGNOSIS — K047 Periapical abscess without sinus: Secondary | ICD-10-CM | POA: Insufficient documentation

## 2021-02-08 DIAGNOSIS — R000 Tachycardia, unspecified: Secondary | ICD-10-CM | POA: Insufficient documentation

## 2021-02-08 DIAGNOSIS — I10 Essential (primary) hypertension: Secondary | ICD-10-CM | POA: Insufficient documentation

## 2021-02-08 MED ORDER — IBUPROFEN 800 MG PO TABS
800.0000 mg | ORAL_TABLET | Freq: Once | ORAL | Status: AC
  Administered 2021-02-08: 800 mg via ORAL
  Filled 2021-02-08: qty 1

## 2021-02-08 MED ORDER — AMOXICILLIN-POT CLAVULANATE 875-125 MG PO TABS
1.0000 | ORAL_TABLET | Freq: Once | ORAL | Status: AC
  Administered 2021-02-08: 1 via ORAL
  Filled 2021-02-08: qty 1

## 2021-02-08 MED ORDER — BENZOCAINE 10 % MT GEL: 1.0000 "application " | OROMUCOSAL | 0 refills | Status: DC | PRN

## 2021-02-08 MED ORDER — AMOXICILLIN-POT CLAVULANATE 875-125 MG PO TABS: 1.0000 | ORAL_TABLET | Freq: Two times a day (BID) | ORAL | 0 refills | Status: DC

## 2021-02-08 NOTE — ED Triage Notes (Signed)
Pt reports upper and lower left sided dental pain. Dentist appointment on sept 14.

## 2021-02-08 NOTE — Discharge Instructions (Addendum)
You were diagnosed with dental infection. Please take entire course of antibiotics as directed.  Continue using ibuprofen / Tylenol, as well as prescribed Orajel for pain.  You will need to follow-up with your dentist for continued management of this. Please see dental resources below. Return to the emergency department for fevers, swelling or pain under the tongue or in the neck, difficulty breathing or swallowing, nausea or vomiting that does not stop, or any other new or concerning symptoms.

## 2021-02-08 NOTE — ED Provider Notes (Addendum)
[facility] EMERGENCY DEPARTMENT Provider Note   CSN: [id number] Arrival date & time: 02/08/21  1745     History Chief Complaint  Patient presents with   Dental Pain    Henry Sanchez is a 45 y.o. male who presents with concern for 2 days of lower dental pain.  History of poor dentition, scheduled for dental consultation on 9/14.  No fevers, chills, nausea, vomiting, difficulty swallowing, tongue, anterior neck  discomfort or swelling.  I personally reads patient's medical records.  He he has history of GERD and is not any medications every day.  HPI     History reviewed. No pertinent past medical history.  Patient Active Problem List   Diagnosis Date Noted   Bell palsy 12/06/2018   GERD (gastroesophageal reflux disease) 08/31/2017    History reviewed. No pertinent surgical history.     History reviewed. No pertinent family history.  Social History   Tobacco Use   Smoking status: Never   Smokeless tobacco: Never  Vaping Use   Vaping Use: Never used  Substance Use Topics   Alcohol use: Yes   Drug use: No    Home Medications Prior to Admission medications   Medication Sig Start Date End Date Taking? Authorizing Provider  amoxicillin-clavulanate (AUGMENTIN) 875-125 MG tablet Take 1 tablet by mouth every 12 (twelve) hours. 02/08/21  Yes Lewis Grivas R, PA-C  benzocaine (ORAJEL) 10 % mucosal gel Use as directed 1 application in the mouth or throat as needed for mouth pain. 02/08/21  Yes Dahl Higinbotham R, PA-C  ondansetron (ZOFRAN) 4 MG tablet Take 1 tablet (4 mg total) by mouth every 6 (six) hours. 09/24/20   Couture, Cortni S, PA-C  valACYclovir (VALTREX) 1000 MG tablet Take 1 tablet (1,000 mg total) by mouth 3 (three) times daily. 11/26/18   Pricilla Loveless, MD    Allergies    Patient has no known allergies.  Review of Systems   Review of Systems  Constitutional: Negative.   HENT:  Positive for dental problem. Negative for sore throat,  tinnitus, trouble swallowing and voice change.   Respiratory: Negative.    Cardiovascular: Negative.   Gastrointestinal: Negative.   Genitourinary: Negative.   Musculoskeletal: Negative.   Skin: Negative.   Neurological: Negative.   Hematological: Negative.    Physical Exam Updated Vital Signs BP (!) 153/108 (BP Location: Left Arm)   Pulse (!) 101   Temp 98.2 F (36.8 C) (Oral)   Resp 16   Ht 5\' 9"  (1.753 m)   Wt 122.5 kg   SpO2 97%   BMI 39.87 kg/m   Physical Exam Vitals and nursing note reviewed.  Constitutional:      Appearance: He is obese. He is not ill-appearing or toxic-appearing.  HENT:     Head: Normocephalic and atraumatic.     Jaw: There is normal jaw occlusion. No trismus.     Mouth/Throat:     Mouth: Mucous membranes are moist.     Dentition: Abnormal dentition. Dental tenderness, gingival swelling and dental caries present. No dental abscesses.     Pharynx: Oropharynx is clear. Uvula midline.      Comments: No sublingual or submental tenderness to palpation, no uvular deviation  or sign of oropharyngeal abscess.  Eyes:     General: Lids are normal. Vision grossly intact. No scleral icterus.       Right eye: No discharge.        Left eye: No discharge.     Extraocular Movements: Extraocular  movements intact.     Conjunctiva/sclera: Conjunctivae normal.     Pupils: Pupils are equal, round, and reactive to light.  Neck:     Trachea: Trachea and phonation normal.  Cardiovascular:     Rate and Rhythm: Normal rate.  Pulmonary:     Effort: Pulmonary effort is normal.  Musculoskeletal:     Cervical back: Normal range of motion. No edema, rigidity or crepitus. No pain with movement, spinous process tenderness or muscular tenderness.  Lymphadenopathy:     Cervical: No cervical adenopathy.  Skin:    General: Skin is warm and dry.  Neurological:     General: No focal deficit present.     Mental Status: He is alert.  Psychiatric:        Mood and Affect:  Mood normal.    ED Results / Procedures / Treatments   Labs (all labs ordered are listed, but only abnormal results are displayed) Labs Reviewed - No data to display  EKG None  Radiology No results found.  Procedures Procedures   Medications Ordered in ED Medications  amoxicillin-clavulanate (AUGMENTIN) 875-125 MG per tablet 1 tablet (has no administration in time range)  ibuprofen (ADVIL) tablet 800 mg (has no administration in time range)    ED Course  I have reviewed the triage vital signs and the nursing notes.  Pertinent labs & imaging results that were available during my care of the patient were reviewed by me and considered in my medical decision making (see chart for details).    MDM Rules/Calculators/A&P                         45 year old male presents with concern for left lower dental pain x48 hours, history of the same.  Differential diagnosis includes is limited to acute dental infection with periapical infection, dental abscess, oropharyngeal abscess, Ludwick's angina.  Hypertensive on intake, very mildly tachycardic.  To my exam is normal or tachycardic.  HEENT exam did reveal dental caries with multiple fractured teeth and obvious decay with surrounding gingival buccal mucosal edema of the left posterior molars on the mandible.  No palpable area of fluctuance to suggest abscess amenable to drainage.  No anterior neck swelling, crepitus, or tenderness to palpation, no sublingual or submental tenderness palpation to suggest Ludwick's angina.  No sign of oropharyngeal abscess.  Patient's presentation most consistent with periapical infection.  Will administer first dose of antibiotics in the emergency department and discharge with antibiotics at home as well as Orajel.  Recommend keeping his appointment for 9/14 with a dentist.  No further warranted in the ED at this time.  [name] voiced understanding of his medical evaluation and treatment plan.  Each of his  questions was answered to his expressed satisfaction.  Return precautions given.  Patient is well-appearing, stable, and appropriate for discharge at this time.  This chart was dictated using voice recognition software, Dragon. Despite the best efforts of this provider to proofread and correct errors, errors may still occur which can change documentation meaning.   Final Clinical Impression(s) / ED Diagnoses Final diagnoses:  Dental infection    Rx / DC Orders ED Discharge Orders          Ordered    amoxicillin-clavulanate (AUGMENTIN) 875-125 MG tablet  Every 12 hours        02/08/21 2219    benzocaine (ORAJEL) 10 % mucosal gel  As needed        02/08/21 2219  Paris Lore, PA-C 02/08/21 2218    Paris Lore, PA-C 02/08/21 2222    Tegeler, Canary Brim, MD 02/08/21 [phone]

## 2021-07-20 DIAGNOSIS — I10 Essential (primary) hypertension: Secondary | ICD-10-CM | POA: Diagnosis not present

## 2021-07-20 DIAGNOSIS — R5383 Other fatigue: Secondary | ICD-10-CM | POA: Diagnosis not present

## 2021-07-23 DIAGNOSIS — R7401 Elevation of levels of liver transaminase levels: Secondary | ICD-10-CM | POA: Diagnosis not present

## 2021-07-23 DIAGNOSIS — Z125 Encounter for screening for malignant neoplasm of prostate: Secondary | ICD-10-CM | POA: Diagnosis not present

## 2021-07-23 DIAGNOSIS — Z0000 Encounter for general adult medical examination without abnormal findings: Secondary | ICD-10-CM | POA: Diagnosis not present

## 2021-07-23 DIAGNOSIS — I10 Essential (primary) hypertension: Secondary | ICD-10-CM | POA: Diagnosis not present

## 2021-07-23 DIAGNOSIS — Z20822 Contact with and (suspected) exposure to covid-19: Secondary | ICD-10-CM | POA: Diagnosis not present

## 2021-07-23 DIAGNOSIS — E559 Vitamin D deficiency, unspecified: Secondary | ICD-10-CM | POA: Diagnosis not present

## 2021-07-23 DIAGNOSIS — Z131 Encounter for screening for diabetes mellitus: Secondary | ICD-10-CM | POA: Diagnosis not present

## 2021-07-23 DIAGNOSIS — R0602 Shortness of breath: Secondary | ICD-10-CM | POA: Diagnosis not present

## 2021-07-30 DIAGNOSIS — R942 Abnormal results of pulmonary function studies: Secondary | ICD-10-CM | POA: Diagnosis not present

## 2021-07-30 DIAGNOSIS — J309 Allergic rhinitis, unspecified: Secondary | ICD-10-CM | POA: Diagnosis not present

## 2021-07-30 DIAGNOSIS — E669 Obesity, unspecified: Secondary | ICD-10-CM | POA: Diagnosis not present

## 2021-07-30 DIAGNOSIS — Z789 Other specified health status: Secondary | ICD-10-CM | POA: Diagnosis not present

## 2021-08-06 DIAGNOSIS — I10 Essential (primary) hypertension: Secondary | ICD-10-CM | POA: Diagnosis not present

## 2021-08-06 DIAGNOSIS — R942 Abnormal results of pulmonary function studies: Secondary | ICD-10-CM | POA: Diagnosis not present

## 2021-08-06 DIAGNOSIS — R7401 Elevation of levels of liver transaminase levels: Secondary | ICD-10-CM | POA: Diagnosis not present

## 2021-08-06 DIAGNOSIS — Z7185 Encounter for immunization safety counseling: Secondary | ICD-10-CM | POA: Diagnosis not present

## 2021-08-06 DIAGNOSIS — E1122 Type 2 diabetes mellitus with diabetic chronic kidney disease: Secondary | ICD-10-CM | POA: Diagnosis not present

## 2021-08-06 DIAGNOSIS — N182 Chronic kidney disease, stage 2 (mild): Secondary | ICD-10-CM | POA: Diagnosis not present

## 2021-08-18 DIAGNOSIS — N182 Chronic kidney disease, stage 2 (mild): Secondary | ICD-10-CM | POA: Diagnosis not present

## 2021-08-18 DIAGNOSIS — E1122 Type 2 diabetes mellitus with diabetic chronic kidney disease: Secondary | ICD-10-CM | POA: Diagnosis not present

## 2021-08-18 DIAGNOSIS — R942 Abnormal results of pulmonary function studies: Secondary | ICD-10-CM | POA: Diagnosis not present

## 2021-08-18 DIAGNOSIS — I10 Essential (primary) hypertension: Secondary | ICD-10-CM | POA: Diagnosis not present

## 2021-08-18 DIAGNOSIS — Z6841 Body Mass Index (BMI) 40.0 and over, adult: Secondary | ICD-10-CM | POA: Diagnosis not present

## 2021-09-04 DIAGNOSIS — R945 Abnormal results of liver function studies: Secondary | ICD-10-CM | POA: Diagnosis not present

## 2021-09-21 DIAGNOSIS — R942 Abnormal results of pulmonary function studies: Secondary | ICD-10-CM | POA: Diagnosis not present

## 2021-09-21 DIAGNOSIS — E1122 Type 2 diabetes mellitus with diabetic chronic kidney disease: Secondary | ICD-10-CM | POA: Diagnosis not present

## 2021-09-21 DIAGNOSIS — N182 Chronic kidney disease, stage 2 (mild): Secondary | ICD-10-CM | POA: Diagnosis not present

## 2021-09-21 DIAGNOSIS — I10 Essential (primary) hypertension: Secondary | ICD-10-CM | POA: Diagnosis not present

## 2021-09-21 DIAGNOSIS — Z6841 Body Mass Index (BMI) 40.0 and over, adult: Secondary | ICD-10-CM | POA: Diagnosis not present

## 2021-10-20 IMAGING — US US ABDOMEN LIMITED RUQ/ASCITES
1 series · 14 of 25 positions shown · non-contrast
Comparison: May 24, 2019.

CLINICAL DATA: Right upper quadrant abdominal pain.

EXAM:
ULTRASOUND ABDOMEN LIMITED RIGHT UPPER QUADRANT

[Series 1: us abdomen limited ruq/ascites · 14 of 46 slices shown]
[im 1/46]
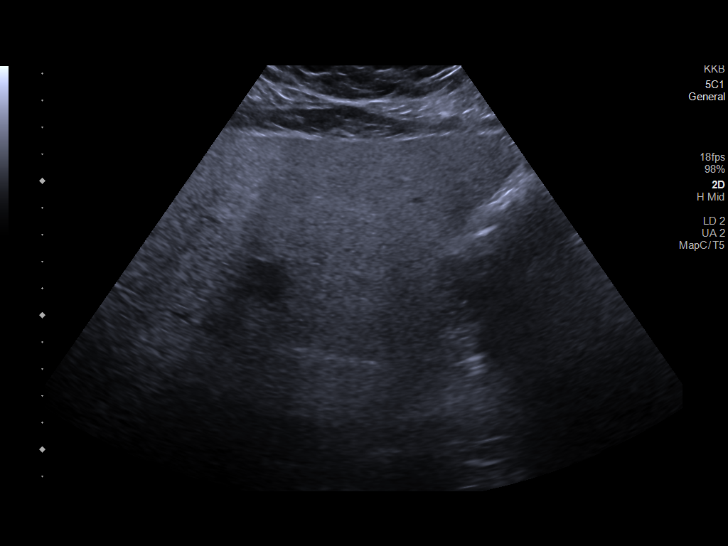
[im 4/46]
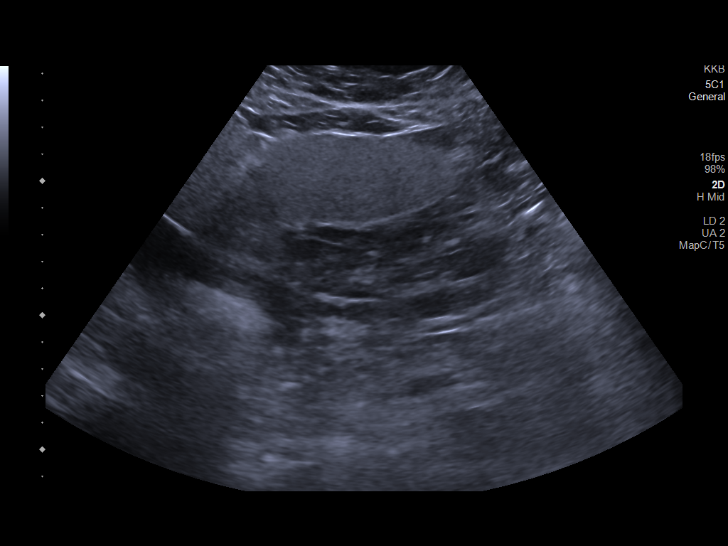
[im 8/46]
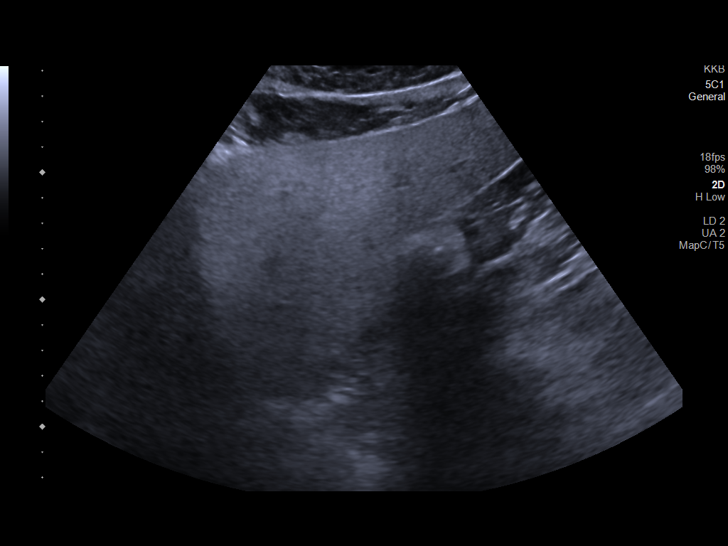
[im 12/46]
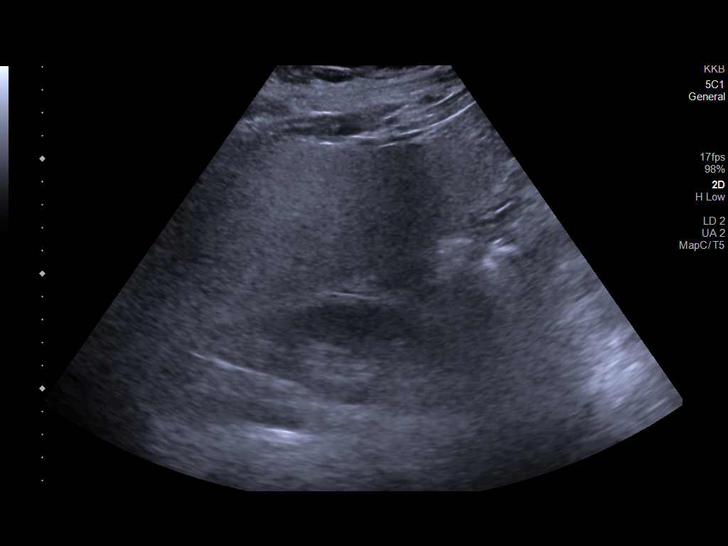
[im 16/46]
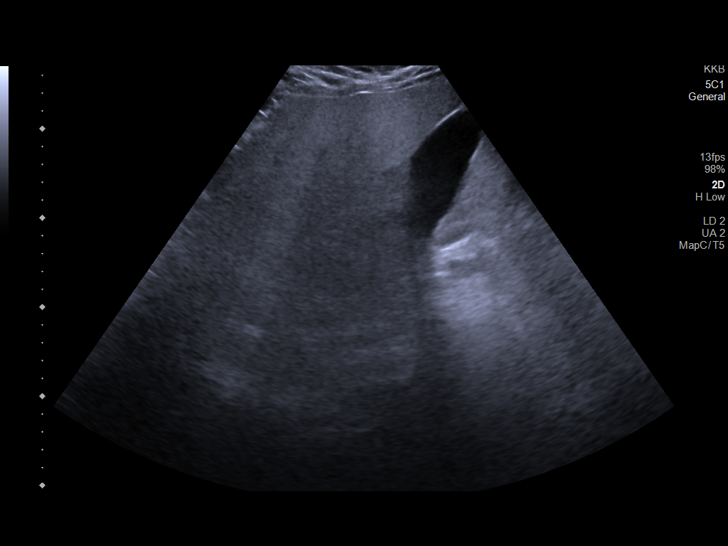
[im 17/46]
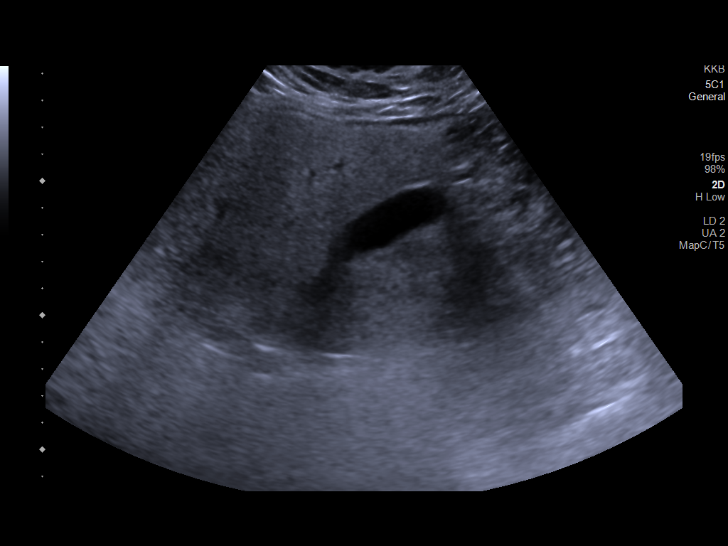
[im 21/46]
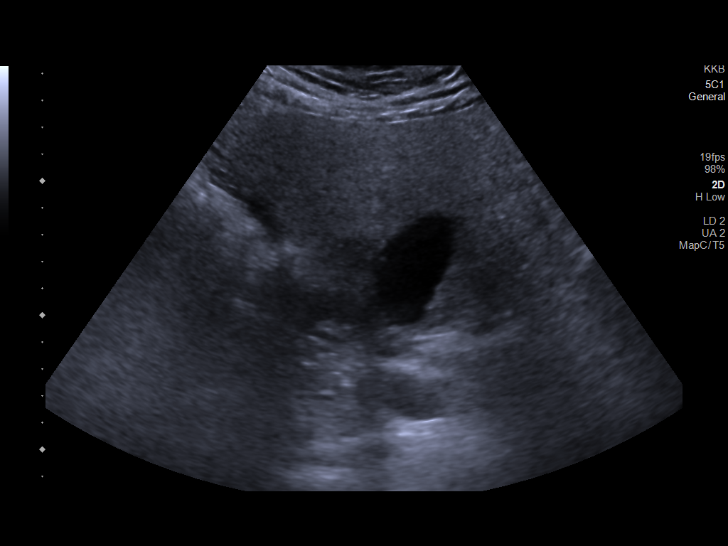
[im 25/46]
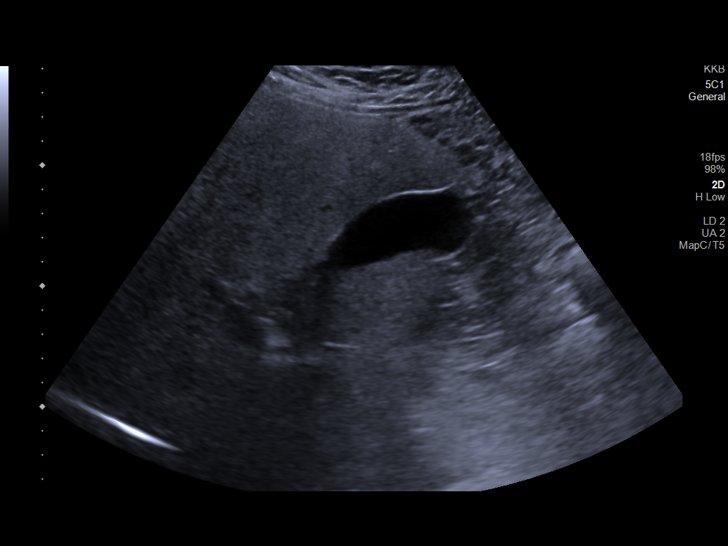
[im 29/46]
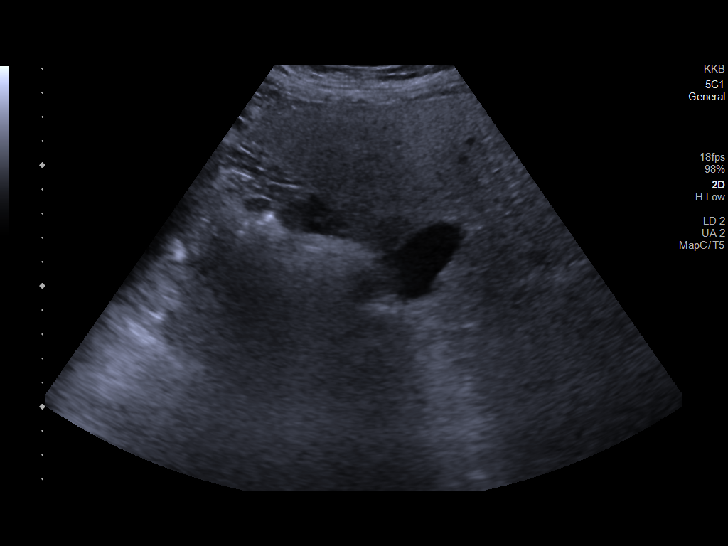
[im 31/46]
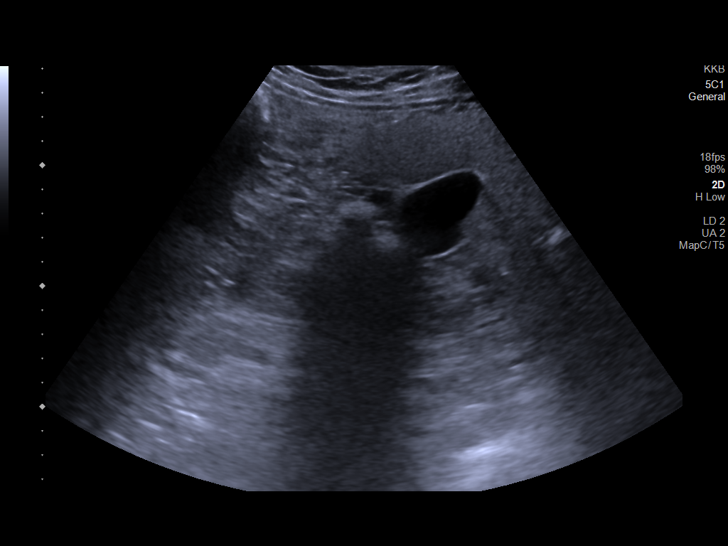
[im 34/46]
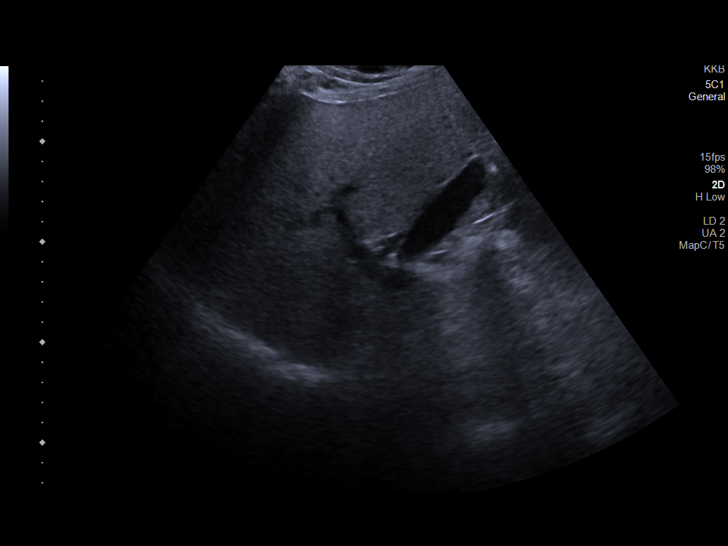
[im 38/46]
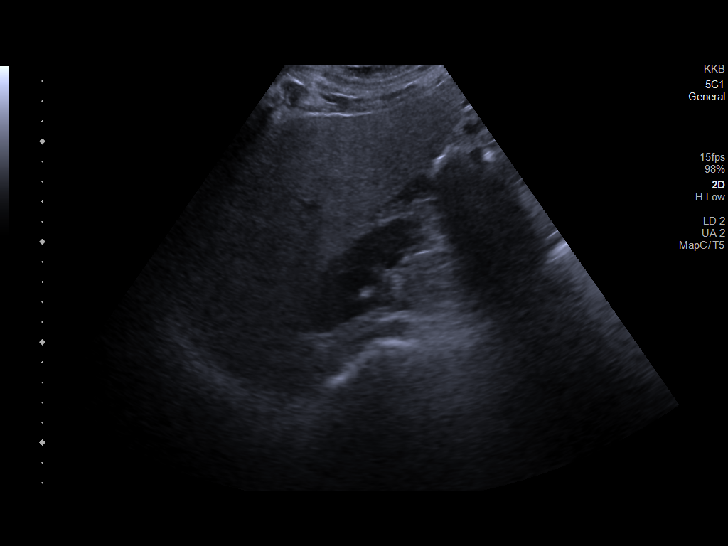
[im 42/46]
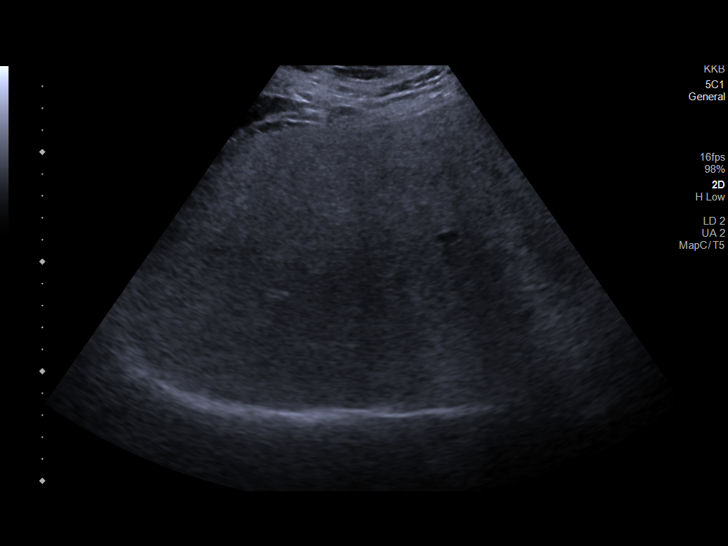
[im 46/46]
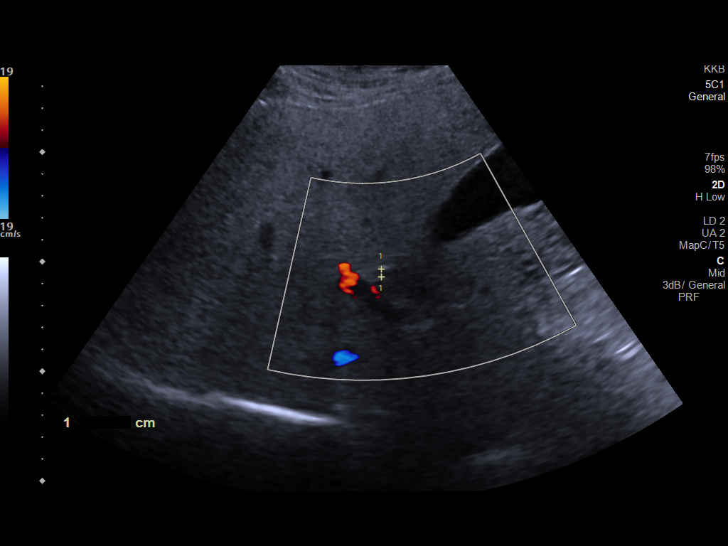

[14 of 25 positions shown; findings below may reference images not displayed]

FINDINGS: Gallbladder:

No gallstones or wall thickening visualized. No sonographic Murphy
sign noted by sonographer.

Common bile duct:

Diameter: 4 mm which is within normal limits.

Liver:

No focal lesion identified. Increased echogenicity of hepatic
parenchyma is noted suggesting hepatic steatosis. Portal vein is
patent on color Doppler imaging with normal direction of blood flow
towards the liver.

Other: None.
IMPRESSION: Probable hepatic steatosis. No other abnormality seen in the right
upper quadrant of the abdomen.

## 2021-10-21 DIAGNOSIS — N182 Chronic kidney disease, stage 2 (mild): Secondary | ICD-10-CM | POA: Diagnosis not present

## 2021-10-21 DIAGNOSIS — E1122 Type 2 diabetes mellitus with diabetic chronic kidney disease: Secondary | ICD-10-CM | POA: Diagnosis not present

## 2021-10-21 DIAGNOSIS — Z6841 Body Mass Index (BMI) 40.0 and over, adult: Secondary | ICD-10-CM | POA: Diagnosis not present

## 2021-10-21 DIAGNOSIS — E7800 Pure hypercholesterolemia, unspecified: Secondary | ICD-10-CM | POA: Diagnosis not present

## 2021-10-21 DIAGNOSIS — E559 Vitamin D deficiency, unspecified: Secondary | ICD-10-CM | POA: Diagnosis not present

## 2021-10-21 DIAGNOSIS — R942 Abnormal results of pulmonary function studies: Secondary | ICD-10-CM | POA: Diagnosis not present

## 2021-10-21 DIAGNOSIS — I10 Essential (primary) hypertension: Secondary | ICD-10-CM | POA: Diagnosis not present

## 2021-11-04 DIAGNOSIS — N182 Chronic kidney disease, stage 2 (mild): Secondary | ICD-10-CM | POA: Diagnosis not present

## 2021-11-04 DIAGNOSIS — R7401 Elevation of levels of liver transaminase levels: Secondary | ICD-10-CM | POA: Diagnosis not present

## 2021-11-04 DIAGNOSIS — E1122 Type 2 diabetes mellitus with diabetic chronic kidney disease: Secondary | ICD-10-CM | POA: Diagnosis not present

## 2021-11-04 DIAGNOSIS — E559 Vitamin D deficiency, unspecified: Secondary | ICD-10-CM | POA: Diagnosis not present

## 2021-11-04 DIAGNOSIS — Z6841 Body Mass Index (BMI) 40.0 and over, adult: Secondary | ICD-10-CM | POA: Diagnosis not present

## 2021-11-04 DIAGNOSIS — I10 Essential (primary) hypertension: Secondary | ICD-10-CM | POA: Diagnosis not present

## 2022-08-03 ENCOUNTER — Ambulatory Visit: Payer: BC Managed Care – PPO | Admitting: Family Medicine

## 2022-08-11 ENCOUNTER — Emergency Department (HOSPITAL_BASED_OUTPATIENT_CLINIC_OR_DEPARTMENT_OTHER)
Admission: EM | Admit: 2022-08-11 | Discharge: 2022-08-11 | Disposition: A | Discharge status: Home / Self Care | Payer: BC Managed Care – PPO | Attending: Emergency Medicine | Admitting: Emergency Medicine

## 2022-08-11 ENCOUNTER — Other Ambulatory Visit: Payer: Self-pay

## 2022-08-11 ENCOUNTER — Encounter (HOSPITAL_BASED_OUTPATIENT_CLINIC_OR_DEPARTMENT_OTHER): Payer: Self-pay | Admitting: Emergency Medicine

## 2022-08-11 DIAGNOSIS — Z1152 Encounter for screening for COVID-19: Secondary | ICD-10-CM | POA: Diagnosis not present

## 2022-08-11 DIAGNOSIS — R000 Tachycardia, unspecified: Secondary | ICD-10-CM | POA: Insufficient documentation

## 2022-08-11 DIAGNOSIS — R112 Nausea with vomiting, unspecified: Secondary | ICD-10-CM | POA: Diagnosis not present

## 2022-08-11 DIAGNOSIS — R197 Diarrhea, unspecified: Secondary | ICD-10-CM | POA: Diagnosis not present

## 2022-08-11 HISTORY — DX: Essential (primary) hypertension: I10

## 2022-08-11 HISTORY — DX: Type 2 diabetes mellitus without complications: E11.9

## 2022-08-11 LAB — COMPREHENSIVE METABOLIC PANEL
ALT: 79 U/L — ABNORMAL HIGH (ref 0–44)
AST: 65 U/L — ABNORMAL HIGH (ref 15–41)
Albumin: 4.1 g/dL (ref 3.5–5.0)
Alkaline Phosphatase: 79 U/L (ref 38–126)
Anion gap: 10 (ref 5–15)
BUN: 16 mg/dL (ref 6–20)
CO2: 23 mmol/L (ref 22–32)
Calcium: 8.9 mg/dL (ref 8.9–10.3)
Chloride: 103 mmol/L (ref 98–111)
Creatinine, Ser: 1.13 mg/dL (ref 0.61–1.24)
GFR, Estimated: >60 mL/min (ref >60)
Glucose, Bld: 150 mg/dL — ABNORMAL HIGH (ref 70–99)
Potassium: 3.8 mmol/L (ref 3.5–5.1)
Sodium: 136 mmol/L (ref 135–145)
Total Bilirubin: 0.7 mg/dL (ref 0.3–1.2)
Total Protein: 8.4 g/dL — ABNORMAL HIGH (ref 6.5–8.1)

## 2022-08-11 LAB — CBC WITH DIFFERENTIAL/PLATELET
Abs Immature Granulocytes: 0.05 10*3/uL (ref 0.00–0.07)
Basophils Absolute: 0 10*3/uL (ref 0.0–0.1)
Basophils Relative: 0 %
Eosinophils Absolute: 0 10*3/uL (ref 0.0–0.5)
Eosinophils Relative: 0 %
HCT: 51.7 % (ref 39.0–52.0)
Hemoglobin: 17.1 g/dL — ABNORMAL HIGH (ref 13.0–17.0)
Immature Granulocytes: 1 %
Lymphocytes Relative: 14 %
Lymphs Abs: 1 10*3/uL (ref 0.7–4.0)
MCH: 27.8 pg (ref 26.0–34.0)
MCHC: 33.1 g/dL (ref 30.0–36.0)
MCV: 84.1 fL (ref 80.0–100.0)
Monocytes Absolute: 0.4 10*3/uL (ref 0.1–1.0)
Monocytes Relative: 5 %
Neutro Abs: 5.8 10*3/uL (ref 1.7–7.7)
Neutrophils Relative %: 80 %
Platelets: 225 10*3/uL (ref 150–400)
RBC: 6.15 MIL/uL — ABNORMAL HIGH (ref 4.22–5.81)
RDW: 13.8 % (ref 11.5–15.5)
WBC: 7.2 10*3/uL (ref 4.0–10.5)
nRBC: 0 % (ref 0.0–0.2)

## 2022-08-11 LAB — RESP PANEL BY RT-PCR (RSV, FLU A&B, COVID)  RVPGX2
Influenza A by PCR: NEGATIVE
Influenza B by PCR: NEGATIVE
Resp Syncytial Virus by PCR: NEGATIVE
SARS Coronavirus 2 by RT PCR: NEGATIVE

## 2022-08-11 LAB — LIPASE, BLOOD: Lipase: 33 U/L (ref 11–51)

## 2022-08-11 MED ORDER — SODIUM CHLORIDE 0.9 % IV BOLUS
1000.0000 mL | Freq: Once | INTRAVENOUS | Status: AC
  Administered 2022-08-11: 1000 mL via INTRAVENOUS

## 2022-08-11 MED ORDER — ONDANSETRON HCL 4 MG/2ML IJ SOLN
4.0000 mg | Freq: Once | INTRAMUSCULAR | Status: AC
  Administered 2022-08-11: 4 mg via INTRAVENOUS
  Filled 2022-08-11: qty 2

## 2022-08-11 MED ORDER — ONDANSETRON 4 MG PO TBDP: ORAL_TABLET | ORAL | 0 refills | Status: DC

## 2022-08-11 MED ORDER — MORPHINE SULFATE (PF) 4 MG/ML IV SOLN
4.0000 mg | Freq: Once | INTRAVENOUS | Status: AC
  Administered 2022-08-11: 4 mg via INTRAVENOUS
  Filled 2022-08-11: qty 1

## 2022-08-11 NOTE — ED Notes (Signed)
Discharge paperwork reviewed entirely with patient, including Rx's and follow up care. Pain was under control. Pt verbalized understanding as well as all parties involved. No questions or concerns voiced at the time of discharge. No acute distress noted.   Pt ambulated out to PVA without incident or assistance.

## 2022-08-11 NOTE — ED Provider Notes (Signed)
[facility] Provider Note   CSN: [id number] Arrival date & time: 08/11/22  1227     History  Chief Complaint  Patient presents with   Emesis    Henry Sanchez is a 47 y.o. male.  47 yo M with a cc of n/v/d.  This been going on since last night.  Was unable to sleep due to the persistence.  He feels like when he lays back flat he feels like he needs to vomit.  No fevers no suspicious food or water intake denies recent travel.  Denies dark or bloody stool denies bilious emesis.   Emesis      Home Medications Prior to Admission medications   Medication Sig Start Date End Date Taking? Authorizing Provider  ondansetron (ZOFRAN-ODT) 4 MG disintegrating tablet '4mg'$  ODT q4 hours prn nausea/vomit 08/11/22  Yes Deno Etienne, DO  amoxicillin-clavulanate (AUGMENTIN) 875-125 MG tablet Take 1 tablet by mouth every 12 (twelve) hours. 02/08/21   Sponseller, Gypsy Balsam, PA-C  benzocaine (ORAJEL) 10 % mucosal gel Use as directed 1 application in the mouth or throat as needed for mouth pain. 02/08/21   Sponseller, Eugene Garnet R, PA-C  ondansetron (ZOFRAN) 4 MG tablet Take 1 tablet (4 mg total) by mouth every 6 (six) hours. 09/24/20   Couture, Cortni S, PA-C  valACYclovir (VALTREX) 1000 MG tablet Take 1 tablet (1,000 mg total) by mouth 3 (three) times daily. 11/26/18   Sherwood Gambler, MD      Allergies    Patient has no known allergies.    Review of Systems   Review of Systems  Gastrointestinal:  Positive for vomiting.    Physical Exam Updated Vital Signs BP 125/82   Pulse (!) 121   Temp 98.6 F (37 C) (Oral)   Resp 17   Ht '5\' 9"'$  (1.753 m)   Wt 123.4 kg   SpO2 96%   BMI 40.17 kg/m  Physical Exam Vitals and nursing note reviewed.  Constitutional:      Appearance: He is well-developed.  HENT:     Head: Normocephalic and atraumatic.  Eyes:     Pupils: Pupils are equal, round, and reactive to light.  Neck:     Vascular: No JVD.   Cardiovascular:     Rate and Rhythm: Regular rhythm. Tachycardia present.     Heart sounds: No murmur heard.    No friction rub. No gallop.  Pulmonary:     Effort: No respiratory distress.     Breath sounds: No wheezing.  Abdominal:     General: There is no distension.     Tenderness: There is no abdominal tenderness. There is no guarding or rebound.  Musculoskeletal:        General: Normal range of motion.     Cervical back: Normal range of motion and neck supple.  Skin:    Coloration: Skin is not pale.     Findings: No rash.  Neurological:     Mental Status: He is alert and oriented to person, place, and time.  Psychiatric:        Behavior: Behavior normal.     ED Results / Procedures / Treatments   Labs (all labs ordered are listed, but only abnormal results are displayed) Labs Reviewed  CBC WITH DIFFERENTIAL/PLATELET - Abnormal; Notable for the following components:      Result Value   RBC 6.15 (*)    Hemoglobin 17.1 (*)    All other components within normal limits  COMPREHENSIVE METABOLIC PANEL - Abnormal; Notable for the following components:   Glucose, Bld 150 (*)    Total Protein 8.4 (*)    AST 65 (*)    ALT 79 (*)    All other components within normal limits  RESP PANEL BY RT-PCR (RSV, FLU A&B, COVID)  RVPGX2  LIPASE, BLOOD    EKG None  Radiology No results found.  Procedures Procedures    Medications Ordered in ED Medications  sodium chloride 0.9 % bolus 1,000 mL (0 mLs Intravenous Stopped 08/11/22 1500)  morphine (PF) 4 MG/ML injection 4 mg (4 mg Intravenous Given 08/11/22 1349)  ondansetron (ZOFRAN) injection 4 mg (4 mg Intravenous Given 08/11/22 1345)    ED Course/ Medical Decision Making/ A&P                             Medical Decision Making Amount and/or Complexity of Data Reviewed Labs: ordered.  Risk Prescription drug management.   47 yo M with a cc of n/v/d.  Started last night.  Patient was given IV fluids pain and nausea  medicine with significant improvement of his symptoms.  His tachycardia has improved significantly but has not resolved.  No leukocytosis, no anemia, no significant electrolyte abnormality.  LFTs are mildly elevated though looks similar to prior and not consistent with acute hepatitis.  Lipase is negative.  Patient feeling better and tolerating by mouth.  Okay with discharge at this time.  PCP follow-up.  3:01 PM:  I have discussed the diagnosis/risks/treatment options with the patient and family.  Evaluation and diagnostic testing in the emergency department does not suggest an emergent condition requiring admission or immediate intervention beyond what has been performed at this time.  They will follow up with PCP. We also discussed returning to the ED immediately if new or worsening sx occur. We discussed the sx which are most concerning (e.g., sudden worsening pain, fever, inability to tolerate by mouth) that necessitate immediate return. Medications administered to the patient during their visit and any new prescriptions provided to the patient are listed below.  Medications given during this visit Medications  sodium chloride 0.9 % bolus 1,000 mL (0 mLs Intravenous Stopped 08/11/22 1500)  morphine (PF) 4 MG/ML injection 4 mg (4 mg Intravenous Given 08/11/22 1349)  ondansetron (ZOFRAN) injection 4 mg (4 mg Intravenous Given 08/11/22 1345)     The patient appears reasonably screen and/or stabilized for discharge and I doubt any other medical condition or other [facility] requiring further screening, evaluation, or treatment in the ED at this time prior to discharge.          Final Clinical Impression(s) / ED Diagnoses Final diagnoses:  Nausea vomiting and diarrhea    Rx / DC Orders ED Discharge Orders          Ordered    ondansetron (ZOFRAN-ODT) 4 MG disintegrating tablet        08/11/22 1500              Deno Etienne, DO 08/11/22 1501

## 2022-08-11 NOTE — ED Notes (Signed)
Cannot discharge, registration in chart.

## 2022-08-11 NOTE — Discharge Instructions (Signed)
Take Imodium for diarrhea.  Please return for worsening symptoms inability eat or drink.

## 2022-08-11 NOTE — ED Triage Notes (Signed)
Pt w/ NVD since 0200; sts has been constant; c/o pain to abd, back, BLE

## 2022-08-17 ENCOUNTER — Encounter: Payer: Self-pay | Admitting: Family Medicine

## 2022-08-17 ENCOUNTER — Ambulatory Visit: Payer: BC Managed Care – PPO | Admitting: Family Medicine

## 2022-08-17 VITALS — BP 138/88 | HR 79 | Temp 97.5°F | Ht 68.0 in | Wt 274.4 lb

## 2022-08-17 DIAGNOSIS — E119 Type 2 diabetes mellitus without complications: Secondary | ICD-10-CM

## 2022-08-17 DIAGNOSIS — I152 Hypertension secondary to endocrine disorders: Secondary | ICD-10-CM | POA: Diagnosis not present

## 2022-08-17 DIAGNOSIS — E1159 Type 2 diabetes mellitus with other circulatory complications: Secondary | ICD-10-CM

## 2022-08-17 DIAGNOSIS — K219 Gastro-esophageal reflux disease without esophagitis: Secondary | ICD-10-CM | POA: Diagnosis not present

## 2022-08-17 DIAGNOSIS — E1169 Type 2 diabetes mellitus with other specified complication: Secondary | ICD-10-CM | POA: Insufficient documentation

## 2022-08-17 LAB — URINALYSIS, ROUTINE W REFLEX MICROSCOPIC
Bilirubin Urine: NEGATIVE
Hgb urine dipstick: NEGATIVE
Leukocytes,Ua: NEGATIVE
Nitrite: NEGATIVE
RBC / HPF: NONE SEEN (ref 0–?)
Specific Gravity, Urine: 1.02 (ref 1.000–1.030)
Total Protein, Urine: NEGATIVE
Urine Glucose: NEGATIVE
Urobilinogen, UA: 1 (ref 0.0–1.0)
pH: 6.5 (ref 5.0–8.0)

## 2022-08-17 LAB — POCT GLYCOSYLATED HEMOGLOBIN (HGB A1C): Hemoglobin A1C: 7.4 % — AB (ref 4.0–5.6)

## 2022-08-17 LAB — MICROALBUMIN / CREATININE URINE RATIO
Creatinine,U: 253.2 mg/dL
Microalb Creat Ratio: 1.2 mg/g (ref 0.0–30.0)
Microalb, Ur: 3.1 mg/dL — ABNORMAL HIGH (ref 0.0–1.9)

## 2022-08-17 MED ORDER — TRULICITY 0.75 MG/0.5ML ~~LOC~~ SOAJ: 0.7500 mg | SUBCUTANEOUS | 2 refills | Status: DC

## 2022-08-17 MED ORDER — AMLODIPINE BESYLATE 10 MG PO TABS: 10.0000 mg | ORAL_TABLET | Freq: Every day | ORAL | 0 refills | Status: DC

## 2022-08-17 MED ORDER — LISINOPRIL 10 MG PO TABS: 10.0000 mg | ORAL_TABLET | Freq: Every day | ORAL | 0 refills | Status: DC

## 2022-08-17 MED ORDER — PANTOPRAZOLE SODIUM 20 MG PO TBEC: 20.0000 mg | DELAYED_RELEASE_TABLET | Freq: Every day | ORAL | 0 refills | Status: DC | PRN

## 2022-08-17 NOTE — Progress Notes (Signed)
Assessment/Plan:   Problem List Items Addressed This Visit       Cardiovascular and Mediastinum   Hypertension associated with diabetes ([place])    Blood pressure is slightly elevated; continue current management. Plan:  Continue current antihypertensive medications: amlodipine 10 mg daily and lisinopril 10 mg daily. Re-evaluate blood pressure control at the next follow-up.      Relevant Medications   Dulaglutide (TRULICITY) A999333 0000000 SOPN   amLODipine (NORVASC) 10 MG tablet   lisinopril (ZESTRIL) 10 MG tablet   Other Relevant Orders   TSH   Lipid panel   Microalbumin / creatinine urine ratio   Urinalysis, Routine w reflex microscopic   CBC with Differential/Platelet   Comprehensive metabolic panel   POCT glycosylated hemoglobin (Hb A1C)     Digestive   GERD (gastroesophageal reflux disease)    The patient reports effective control of GERD symptoms with pantoprazole as needed.   Plan: Continue pantoprazole on an as-needed basis.      Relevant Medications   pantoprazole (PROTONIX) 20 MG tablet     Endocrine   Type 2 diabetes mellitus without complication, without long-term current use of insulin (HCC) - Primary    Current A1C of 7.4% indicates suboptimal diabetes control, likely due to discontinuation of Ozempic.   Plan: Initiate Trulicity at A999333 [id number] mL weekly. If Trulicity is cost-prohibitive, consider alternative such as SGLT2 inhibitors or Metformin. Recheck in 3 months, unless symptoms suggest the need for an earlier reevaluation.      Relevant Medications   Dulaglutide (TRULICITY) A999333 0000000 SOPN   lisinopril (ZESTRIL) 10 MG tablet   Other Relevant Orders   TSH   Lipid panel   Microalbumin / creatinine urine ratio   Urinalysis, Routine w reflex microscopic   CBC with Differential/Platelet   Comprehensive metabolic panel   POCT glycosylated hemoglobin (Hb A1C)     Other   Class 3 severe obesity with serious comorbidity in adult [facility])    The  patient's BMI is 41.72, indicating class 3 obesity.   Plan:  Education on diet and exercise appropriate for diabetic patients. If Trulicity is well-tolerated, monitor for potential weight loss benefits. Re-evaluate in 3 months or sooner, based on medication tolerance and weight changes.      Relevant Medications   Dulaglutide (TRULICITY) A999333 0000000 SOPN   Other Relevant Orders   TSH   Lipid panel   Microalbumin / creatinine urine ratio   Urinalysis, Routine w reflex microscopic   CBC with Differential/Platelet   Comprehensive metabolic panel   POCT glycosylated hemoglobin (Hb A1C)    Medications Discontinued During This Encounter  Medication Reason   amoxicillin-clavulanate (AUGMENTIN) 875-125 MG tablet    benzocaine (ORAJEL) 10 % mucosal gel    ondansetron (ZOFRAN) 4 MG tablet    OZEMPIC, 0.25 OR 0.5 MG/DOSE, 2 MG/3ML SOPN    ondansetron (ZOFRAN-ODT) 4 MG disintegrating tablet    amLODipine (NORVASC) 10 MG tablet Reorder   lisinopril (ZESTRIL) 10 MG tablet Reorder   pantoprazole (PROTONIX) 20 MG tablet Reorder      Subjective:  HPI: Encounter date: 08/17/2022  Henry Sanchez is a 47 y.o. male who has GERD (gastroesophageal reflux disease); Bell palsy; Type 2 diabetes mellitus without complication, without long-term current use of insulin ([place]); Hypertension associated with diabetes ([place]); and Class 3 severe obesity with serious comorbidity in adult [facility]) on their problem list..   He  has a past medical history of Diabetes mellitus without complication ([place]) and Hypertension.Marland Kitchen  CHIEF COMPLAINT: Henry Sanchez, a 47 year old male, presents to establish care and requests a refill of pantoprazole for gastroesophageal reflux disease (GERD).  He reports an associate's degree in food nutrition and is pursuing certification as a Designer, jewellery.  HISTORY OF PRESENT ILLNESS:   GERD: The patient reports taking pantoprazole for GERD with no new or worsening symptoms.  Continuation of current dosing is indicated.  Diabetes: Henry Sanchez has a history of type 2 diabetes mellitus without complications and is not currently on long-term insulin. He was previously on dulaglutide (Ozempic) but discontinued it in November due to cost concerns. He reports symptoms of polyuria and polydipsia.   Hypertension: The patient has a longstanding history of hypertension, managed with amlodipine and lisinopril.   ROS:  Cardiology: No chest pain, shortness of breath, or edema. Ophthalmology: Recent episode of blurred vision, resolved. No history of eye exams for diabetes retinopathy screening. Endocrine: Symptoms of polyuria and polydipsia are noted. Remainder of ROS negative.  History reviewed. No pertinent surgical history.  Outpatient Medications Prior to Visit  Medication Sig Dispense Refill   amLODipine (NORVASC) 10 MG tablet Take 10 mg by mouth daily.     lisinopril (ZESTRIL) 10 MG tablet Take 10 mg by mouth daily.     ondansetron (ZOFRAN-ODT) 4 MG disintegrating tablet '4mg'$  ODT q4 hours prn nausea/vomit 20 tablet 0   OZEMPIC, 0.25 OR 0.5 MG/DOSE, 2 MG/3ML SOPN Inject 0.5 mg into the skin once a week.     pantoprazole (PROTONIX) 20 MG tablet Take by mouth.     valACYclovir (VALTREX) 1000 MG tablet Take 1 tablet (1,000 mg total) by mouth 3 (three) times daily. (Patient not taking: Reported on 08/17/2022) 20 tablet 0   amoxicillin-clavulanate (AUGMENTIN) 875-125 MG tablet Take 1 tablet by mouth every 12 (twelve) hours. (Patient not taking: Reported on 08/17/2022) 14 tablet 0   benzocaine (ORAJEL) 10 % mucosal gel Use as directed 1 application in the mouth or throat as needed for mouth pain. (Patient not taking: Reported on 08/17/2022) 5.3 g 0   ondansetron (ZOFRAN) 4 MG tablet Take 1 tablet (4 mg total) by mouth every 6 (six) hours. (Patient not taking: Reported on 08/17/2022) 12 tablet 0   No facility-administered medications prior to visit.    Family History  Problem Relation  Age of Onset   Breast cancer Mother     Social History   Socioeconomic History   Marital status: Single    Spouse name: Not on file   Number of children: Not on file   Years of education: Not on file   Highest education level: Not on file  Occupational History   Not on file  Tobacco Use   Smoking status: Never    Passive exposure: Never   Smokeless tobacco: Never  Vaping Use   Vaping Use: Never used  Substance and Sexual Activity   Alcohol use: Never   Drug use: No   Sexual activity: Not on file  Other Topics Concern   Not on file  Social History Narrative   Not on file   Social Determinants of Health   Financial Resource Strain: Not on file  Food Insecurity: Not on file  Transportation Needs: Not on file  Physical Activity: Not on file  Stress: Not on file  Social Connections: Not on file  Intimate Partner Violence: Not on file  Objective:  Physical Exam: BP 138/88 (BP Location: Left Arm, Patient Position: Sitting, Cuff Size: Large)   Pulse 79   Temp (!) 97.5 F (36.4 C) (Temporal)   Ht '5\' 8"'$  (1.727 m)   Wt 274 lb 6.4 oz (124.5 kg)   SpO2 97%   BMI 41.72 kg/m    Physical Exam Constitutional:      General: He is not in acute distress.    Appearance: Normal appearance. He is not ill-appearing or toxic-appearing.  HENT:     Head: Normocephalic.     Right Ear: Tympanic membrane, ear canal and external ear normal. There is no impacted cerumen.     Left Ear: Tympanic membrane, ear canal and external ear normal. There is no impacted cerumen.     Nose: Nose normal. No congestion.     Mouth/Throat:     Mouth: Mucous membranes are moist.     Pharynx: Oropharynx is clear. No oropharyngeal exudate.  Eyes:     General: No scleral icterus.       Right eye: No discharge.        Left eye: No discharge.     Conjunctiva/sclera: Conjunctivae normal.     Pupils: Pupils  are equal, round, and reactive to light.  Cardiovascular:     Rate and Rhythm: Normal rate and regular rhythm.     Pulses: Normal pulses.     Heart sounds: Normal heart sounds.  Pulmonary:     Effort: Pulmonary effort is normal. No respiratory distress.     Breath sounds: Normal breath sounds.  Abdominal:     General: Abdomen is flat. Bowel sounds are normal.     Palpations: Abdomen is soft.  Musculoskeletal:        General: Normal range of motion.     Cervical back: Normal range of motion.  Lymphadenopathy:     Cervical: No cervical adenopathy.  Skin:    General: Skin is warm and dry.     Findings: No rash.  Neurological:     General: No focal deficit present.     Mental Status: He is alert and oriented to person, place, and time. Mental status is at baseline.  Psychiatric:        Mood and Affect: Mood normal.        Behavior: Behavior normal.        Thought Content: Thought content normal.        Judgment: Judgment normal.    POC HGA1C: 7.4      Alesia Banda, MD, MS

## 2022-08-17 NOTE — Patient Instructions (Addendum)
For diabetes, we are prescribing Trulicity. For his blood pressure, we refilled amlodipine and lisinopril. We are checking blood work as discussed. For heartburn, we refilled Protonix.

## 2022-08-17 NOTE — Assessment & Plan Note (Signed)
Current A1C of 7.4% indicates suboptimal diabetes control, likely due to discontinuation of Ozempic.   Plan: Initiate Trulicity at [id number] mL weekly. If Trulicity is cost-prohibitive, consider alternative such as SGLT2 inhibitors or Metformin. Recheck in 3 months, unless symptoms suggest the need for an earlier reevaluation.

## 2022-08-17 NOTE — Assessment & Plan Note (Signed)
The patient's BMI is 41.72, indicating class 3 obesity.   Plan:  Education on diet and exercise appropriate for diabetic patients. If Trulicity is well-tolerated, monitor for potential weight loss benefits. Re-evaluate in 3 months or sooner, based on medication tolerance and weight changes.

## 2022-08-17 NOTE — Assessment & Plan Note (Signed)
The patient reports effective control of GERD symptoms with pantoprazole as needed.   Plan: Continue pantoprazole on an as-needed basis.

## 2022-08-17 NOTE — Assessment & Plan Note (Signed)
Blood pressure is slightly elevated; continue current management. Plan:  Continue current antihypertensive medications: amlodipine 10 mg daily and lisinopril 10 mg daily. Re-evaluate blood pressure control at the next follow-up.

## 2022-08-18 LAB — CBC WITH DIFFERENTIAL/PLATELET
Basophils Absolute: 0.1 10*3/uL (ref 0.0–0.1)
Basophils Relative: 1.5 % (ref 0.0–3.0)
Eosinophils Absolute: 0.1 10*3/uL (ref 0.0–0.7)
Eosinophils Relative: 1.4 % (ref 0.0–5.0)
HCT: 46.6 % (ref 39.0–52.0)
Hemoglobin: 15.4 g/dL (ref 13.0–17.0)
Lymphocytes Relative: 44.3 % (ref 12.0–46.0)
Lymphs Abs: 2.5 10*3/uL (ref 0.7–4.0)
MCHC: 33 g/dL (ref 30.0–36.0)
MCV: 86 fl (ref 78.0–100.0)
Monocytes Absolute: 0.4 10*3/uL (ref 0.1–1.0)
Monocytes Relative: 7.4 % (ref 3.0–12.0)
Neutro Abs: 2.6 10*3/uL (ref 1.4–7.7)
Neutrophils Relative %: 45.4 % (ref 43.0–77.0)
Platelets: 208 10*3/uL (ref 150.0–400.0)
RBC: 5.41 Mil/uL (ref 4.22–5.81)
RDW: 14.6 % (ref 11.5–15.5)
WBC: 5.7 10*3/uL (ref 4.0–10.5)

## 2022-08-18 LAB — COMPREHENSIVE METABOLIC PANEL
ALT: 61 U/L — ABNORMAL HIGH (ref 0–53)
AST: 40 U/L — ABNORMAL HIGH (ref 0–37)
Albumin: 4.1 g/dL (ref 3.5–5.2)
Alkaline Phosphatase: 73 U/L (ref 39–117)
BUN: 11 mg/dL (ref 6–23)
CO2: 28 mEq/L (ref 19–32)
Calcium: 9.8 mg/dL (ref 8.4–10.5)
Chloride: 100 mEq/L (ref 96–112)
Creatinine, Ser: 1.07 mg/dL (ref 0.40–1.50)
GFR: 84.47 mL/min (ref >60.00)
Glucose, Bld: 87 mg/dL (ref 70–99)
Potassium: 3.4 mEq/L — ABNORMAL LOW (ref 3.5–5.1)
Sodium: 139 mEq/L (ref 135–145)
Total Bilirubin: 0.4 mg/dL (ref 0.2–1.2)
Total Protein: 7.4 g/dL (ref 6.0–8.3)

## 2022-08-18 LAB — LIPID PANEL
Cholesterol: 138 mg/dL (ref 0–200)
HDL: 31.2 mg/dL — ABNORMAL LOW (ref >39.00)
LDL Cholesterol: 87 mg/dL (ref 0–99)
NonHDL: 107.04
Total CHOL/HDL Ratio: 4
Triglycerides: 102 mg/dL (ref 0.0–149.0)
VLDL: 20.4 mg/dL (ref 0.0–40.0)

## 2022-08-18 LAB — TSH: TSH: 0.78 u[IU]/mL (ref 0.35–5.50)

## 2022-11-06 ENCOUNTER — Other Ambulatory Visit: Payer: Self-pay | Admitting: Family Medicine

## 2022-11-06 DIAGNOSIS — E119 Type 2 diabetes mellitus without complications: Secondary | ICD-10-CM

## 2022-11-06 DIAGNOSIS — I152 Hypertension secondary to endocrine disorders: Secondary | ICD-10-CM

## 2022-11-06 DIAGNOSIS — K219 Gastro-esophageal reflux disease without esophagitis: Secondary | ICD-10-CM

## 2022-11-09 NOTE — Telephone Encounter (Signed)
Chart supports rx. Last OV: 08/17/2022 Next OV:11/18/2022

## 2022-11-12 ENCOUNTER — Other Ambulatory Visit: Payer: Self-pay | Admitting: Family Medicine

## 2022-11-12 DIAGNOSIS — E119 Type 2 diabetes mellitus without complications: Secondary | ICD-10-CM

## 2022-11-12 DIAGNOSIS — E1159 Type 2 diabetes mellitus with other circulatory complications: Secondary | ICD-10-CM

## 2022-11-12 NOTE — Telephone Encounter (Signed)
Chart supports rx. Last OV: 08/17/2022 Next OV:11/18/2022  

## 2022-11-18 ENCOUNTER — Encounter: Payer: Self-pay | Admitting: Family Medicine

## 2022-11-18 ENCOUNTER — Ambulatory Visit: Payer: BC Managed Care – PPO | Admitting: Family Medicine

## 2022-11-18 VITALS — BP 116/84 | HR 90 | Temp 97.6°F | Wt 272.0 lb

## 2022-11-18 DIAGNOSIS — Z7985 Long-term (current) use of injectable non-insulin antidiabetic drugs: Secondary | ICD-10-CM

## 2022-11-18 DIAGNOSIS — E1159 Type 2 diabetes mellitus with other circulatory complications: Secondary | ICD-10-CM

## 2022-11-18 DIAGNOSIS — K219 Gastro-esophageal reflux disease without esophagitis: Secondary | ICD-10-CM | POA: Diagnosis not present

## 2022-11-18 DIAGNOSIS — E119 Type 2 diabetes mellitus without complications: Secondary | ICD-10-CM

## 2022-11-18 DIAGNOSIS — I152 Hypertension secondary to endocrine disorders: Secondary | ICD-10-CM

## 2022-11-18 LAB — POCT GLYCOSYLATED HEMOGLOBIN (HGB A1C): Hemoglobin A1C: 6.6 % — AB (ref 4.0–5.6)

## 2022-11-18 MED ORDER — TIRZEPATIDE 2.5 MG/0.5ML ~~LOC~~ SOAJ: 2.5000 mg | SUBCUTANEOUS | 0 refills | Status: AC

## 2022-11-18 MED ORDER — TIRZEPATIDE 5 MG/0.5ML ~~LOC~~ SOAJ: 5.0000 mg | SUBCUTANEOUS | 1 refills | Status: DC

## 2022-11-18 MED ORDER — ONDANSETRON HCL 4 MG PO TABS: 4.0000 mg | ORAL_TABLET | Freq: Three times a day (TID) | ORAL | 0 refills | Status: DC | PRN

## 2022-11-18 NOTE — Patient Instructions (Addendum)
Stop taking Trulicity 0.75 mg.  Start tirzepatide 2.5 mg injection for 1 month, if tolerating may increase to 0.5 mg injection. Monitor blood pressure at home regularly and ensure it stays within the normal range.  Current medications include Amlodipine 10 mg and Lisinopril 10 mg. Schedule an appointment with an eye doctor for a diabetic eye exam to monitor any potential complications from diabetes affecting your vision. Maintain smaller meals to manage any potential nausea related to the new medication, Tirzepatide (2.5 mg), prescribed for weight management. Pick up Zofran from the pharmacy to have on hand in case of nausea, and continue monitoring overall health and well-being.

## 2022-11-18 NOTE — Assessment & Plan Note (Signed)
The patient reports effective control of GERD symptoms with pantoprazole as needed.   Plan: Continue pantoprazole on an as-needed basis. 

## 2022-11-18 NOTE — Progress Notes (Signed)
Assessment/Plan:   Problem List Items Addressed This Visit       Cardiovascular and Mediastinum   Hypertension associated with diabetes (HCC)    Hypertension controlled on current regimen. BP today was 116/84.  Plan:  Continue Amlodipine 10 mg daily. Continue Lisinopril 10 mg daily. Monitor for potential dose adjustments in next visit (3 months).      Relevant Medications   tirzepatide (MOUNJARO) 2.5 MG/0.5ML Pen   tirzepatide (MOUNJARO) 5 MG/0.5ML Pen     Digestive   GERD (gastroesophageal reflux disease)    The patient reports effective control of GERD symptoms with pantoprazole as needed.   Plan: Continue pantoprazole on an as-needed basis.      Relevant Medications   ondansetron (ZOFRAN) 4 MG tablet     Endocrine   Type 2 diabetes mellitus without complication, without long-term current use of insulin (HCC) - Primary    Diabetes is well controlled with current A1C at 6.6%. Patient tolerates Trulicity well but will switch to [facility] for enhanced weight management benefits. Plan:  Discontinue Trulicity. Start Tirzepatide 2.5 mg once weekly for one month. Increase dose to 5 mg once weekly after one month, if tolerated. Provide Zofran for potential nausea. Schedule follow-up in 3 months to assess A1C and weight management. Reinforce the importance of diet and exercise. Order yearly ophthalmology referral for diabetic eye exam.      Relevant Medications   tirzepatide (MOUNJARO) 2.5 MG/0.5ML Pen   tirzepatide (MOUNJARO) 5 MG/0.5ML Pen   Other Relevant Orders   POCT glycosylated hemoglobin (Hb A1C) (Completed)   Ambulatory referral to Ophthalmology     Other   Class 3 severe obesity with serious comorbidity in adult [facility])   Relevant Medications   tirzepatide (MOUNJARO) 2.5 MG/0.5ML Pen   tirzepatide (MOUNJARO) 5 MG/0.5ML Pen    Medications Discontinued During This Encounter  Medication Reason   TRULICITY 0.75 MG/0.5ML SOPN     Return in about 3 months  (around 02/18/2023) for BP, DM.    Subjective:   Encounter date: 11/18/2022  Henry Sanchez is a 47 y.o. male who has GERD (gastroesophageal reflux disease); Bell palsy; Type 2 diabetes mellitus without complication, without long-term current use of insulin (HCC); Hypertension associated with diabetes (HCC); and Class 3 severe obesity with serious comorbidity in adult [facility]) on their problem list..   He  has a past medical history of Diabetes mellitus without complication (HCC) and Hypertension..   Chief Complaint: Follow-up for diabetes management, hypertension, and weight management.  History of Present Illness:  Henry Sanchez is a 47 year old male presenting for a follow-up on diabetes management. His Hemoglobin A1C was 6.6% today, which is an improvement from 7.4%. He is currently on Trulicity 0.75 mg with good tolerance and no side effects. Weight management was discussed, and options for newer medications were explored.  Patient's blood pressure is near goal.  He is tolerating amlodipine 10 mg and lisinopril 10 mg well without side effects.  HGBA1C Lab Results  Component Value Date   HGBA1C 6.6 (A) 11/18/2022   HGBA1C 7.4 (A) 08/17/2022    Lipid Panel Lab Results  Component Value Date   CHOL 138 08/17/2022   TRIG 102.0 08/17/2022   HDL 31.20 (L) 08/17/2022   LDLCALC 87 08/17/2022     Last metabolic panel Lab Results  Component Value Date   GLUCOSE 87 08/17/2022   NA 139 08/17/2022   K 3.4 (L) 08/17/2022   CL 100 08/17/2022   CO2 28 08/17/2022  BUN 11 08/17/2022   CREATININE 1.07 08/17/2022   GFRNONAA >60 08/11/2022   CALCIUM 9.8 08/17/2022   PROT 7.4 08/17/2022   ALBUMIN 4.1 08/17/2022   BILITOT 0.4 08/17/2022   ALKPHOS 73 08/17/2022   AST 40 (H) 08/17/2022   ALT 61 (H) 08/17/2022   ANIONGAP 10 08/11/2022   Review of Systems  Constitutional:  Negative for chills, diaphoresis, fever, malaise/fatigue and weight loss.  HENT:  Negative for congestion, ear  discharge, ear pain and hearing loss.   Eyes:  Negative for blurred vision, double vision, photophobia, pain, discharge and redness.  Respiratory:  Negative for cough, sputum production, shortness of breath and wheezing.   Cardiovascular:  Negative for chest pain and palpitations.  Gastrointestinal:  Negative for abdominal pain, blood in stool, constipation, diarrhea, heartburn (Well-controlled with Protonix), melena, nausea and vomiting.  Genitourinary:  Negative for dysuria, flank pain, frequency, hematuria and urgency.  Musculoskeletal:  Negative for myalgias.  Skin:  Negative for itching and rash.  Neurological:  Negative for dizziness, tingling, tremors, speech change, seizures, loss of consciousness, weakness and headaches.  Endo/Heme/Allergies:  Negative for polydipsia.  Psychiatric/Behavioral:  Negative for depression, hallucinations, memory loss, substance abuse and suicidal ideas. The patient is not nervous/anxious and does not have insomnia.   All other systems reviewed and are negative.   No past surgical history on file.  Outpatient Medications Prior to Visit  Medication Sig Dispense Refill   amLODipine (NORVASC) 10 MG tablet Take 1 tablet by mouth once daily 90 tablet 0   lisinopril (ZESTRIL) 10 MG tablet Take 1 tablet by mouth once daily 90 tablet 0   pantoprazole (PROTONIX) 20 MG tablet TAKE 1 TABLET BY MOUTH ONCE DAILY AS NEEDED FOR  HEARTBURN  OR  INDIGESTION. 90 tablet 0   TRULICITY 0.75 MG/0.5ML SOPN INJECT 0.75 MG INTO THE SKIN ONCE A WEEK 4 mL 0   valACYclovir (VALTREX) 1000 MG tablet Take 1 tablet (1,000 mg total) by mouth 3 (three) times daily. (Patient not taking: Reported on 08/17/2022) 20 tablet 0   No facility-administered medications prior to visit.    Family History  Problem Relation Age of Onset   Breast cancer Mother     Social History   Socioeconomic History   Marital status: Single    Spouse name: Not on file   Number of children: Not on file    Years of education: Not on file   Highest education level: Not on file  Occupational History   Not on file  Tobacco Use   Smoking status: Never    Passive exposure: Never   Smokeless tobacco: Never  Vaping Use   Vaping Use: Never used  Substance and Sexual Activity   Alcohol use: Never   Drug use: No   Sexual activity: Not on file  Other Topics Concern   Not on file  Social History Narrative   Not on file   Social Determinants of Health   Financial Resource Strain: Not on file  Food Insecurity: Not on file  Transportation Needs: Not on file  Physical Activity: Not on file  Stress: Not on file  Social Connections: Not on file  Intimate Partner Violence: Not on file  Objective:  Physical Exam: BP 116/84 (BP Location: Left Arm, Patient Position: Sitting, Cuff Size: Large)   Pulse 90   Temp 97.6 F (36.4 C) (Temporal)   Wt 272 lb (123.4 kg)   SpO2 95%   BMI 41.36 kg/m   Wt Readings from Last 3 Encounters:  11/18/22 272 lb (123.4 kg)  08/17/22 274 lb 6.4 oz (124.5 kg)  08/11/22 272 lb (123.4 kg)     Physical Exam Constitutional:      Appearance: Normal appearance.  HENT:     Head: Normocephalic and atraumatic.     Right Ear: Hearing normal.     Left Ear: Hearing normal.     Nose: Nose normal.  Eyes:     General: No scleral icterus.       Right eye: No discharge.        Left eye: No discharge.     Extraocular Movements: Extraocular movements intact.  Cardiovascular:     Rate and Rhythm: Normal rate and regular rhythm.     Heart sounds: Normal heart sounds.  Pulmonary:     Effort: Pulmonary effort is normal.     Breath sounds: Normal breath sounds.  Abdominal:     Palpations: Abdomen is soft.     Tenderness: There is no abdominal tenderness.  Skin:    General: Skin is warm.     Findings: No rash.  Neurological:     General: No focal deficit present.      Mental Status: He is alert.     Cranial Nerves: No cranial nerve deficit.  Psychiatric:        Mood and Affect: Mood normal.        Behavior: Behavior normal.        Thought Content: Thought content normal.        Judgment: Judgment normal.     Diabetic Foot Exam - Simple   Simple Foot Form Diabetic Foot exam was performed with the following findings: Yes 11/18/2022  2:48 PM  Visual Inspection No deformities, no ulcerations, no other skin breakdown bilaterally: Yes Sensation Testing Intact to touch and monofilament testing bilaterally: Yes Pulse Check Posterior Tibialis and Dorsalis pulse intact bilaterally: Yes Comments      No results found.  Recent Results (from the past 2160 hour(s))  POCT glycosylated hemoglobin (Hb A1C)     Status: Abnormal   Collection Time: 11/18/22  2:24 PM  Result Value Ref Range   Hemoglobin A1C 6.6 (A) 4.0 - 5.6 %   HbA1c POC (<> result, manual entry)     HbA1c, POC (prediabetic range)     HbA1c, POC (controlled diabetic range)          Garner Nash, MD, MS

## 2022-11-18 NOTE — Assessment & Plan Note (Signed)
Diabetes is well controlled with current A1C at 6.6%. Patient tolerates Trulicity well but will switch to [facility] for enhanced weight management benefits. Plan:  Discontinue Trulicity. Start Tirzepatide 2.5 mg once weekly for one month. Increase dose to 5 mg once weekly after one month, if tolerated. Provide Zofran for potential nausea. Schedule follow-up in 3 months to assess A1C and weight management. Reinforce the importance of diet and exercise. Order yearly ophthalmology referral for diabetic eye exam.

## 2022-11-18 NOTE — Assessment & Plan Note (Signed)
Hypertension controlled on current regimen. BP today was 116/84.  Plan:  Continue Amlodipine 10 mg daily. Continue Lisinopril 10 mg daily. Monitor for potential dose adjustments in next visit (3 months).

## 2023-01-06 ENCOUNTER — Encounter (HOSPITAL_BASED_OUTPATIENT_CLINIC_OR_DEPARTMENT_OTHER): Payer: Self-pay | Admitting: Emergency Medicine

## 2023-01-06 ENCOUNTER — Emergency Department (HOSPITAL_BASED_OUTPATIENT_CLINIC_OR_DEPARTMENT_OTHER)
Admission: EM | Admit: 2023-01-06 | Discharge: 2023-01-06 | Disposition: A | Discharge status: Home / Self Care | Payer: BC Managed Care – PPO | Attending: Emergency Medicine | Admitting: Emergency Medicine

## 2023-01-06 ENCOUNTER — Other Ambulatory Visit: Payer: Self-pay

## 2023-01-06 ENCOUNTER — Emergency Department (HOSPITAL_BASED_OUTPATIENT_CLINIC_OR_DEPARTMENT_OTHER): Payer: BC Managed Care – PPO

## 2023-01-06 DIAGNOSIS — M7752 Other enthesopathy of left foot: Secondary | ICD-10-CM | POA: Diagnosis not present

## 2023-01-06 DIAGNOSIS — M79672 Pain in left foot: Secondary | ICD-10-CM | POA: Insufficient documentation

## 2023-01-06 MED ORDER — NAPROXEN 500 MG PO TABS: 500.0000 mg | ORAL_TABLET | Freq: Two times a day (BID) | ORAL | 0 refills | Status: DC

## 2023-01-06 MED ORDER — NAPROXEN 250 MG PO TABS
500.0000 mg | ORAL_TABLET | Freq: Once | ORAL | Status: AC
  Administered 2023-01-06: 500 mg via ORAL
  Filled 2023-01-06: qty 2

## 2023-01-06 NOTE — ED Triage Notes (Signed)
Patient arrives ambulatory by POV c/o left foot pain x 1 week. Denies any injury. Describes it as a sore feeling.

## 2023-01-06 NOTE — ED Provider Notes (Signed)
[facility] [facility]MENT AT [facility] Provider Note   CSN: [id number] Arrival date & time: 01/06/23  1308     History Chief Complaint  Patient presents with   Foot Pain    Henry Sanchez is a 47 y.o. male patient who presents to the emergency department today for further evaluation of left foot pain.  This is been ongoing for 1 week and constant.  He is been taking Tylenol with little relief.  He denies any injury to the foot.  He does endorse associated swelling and the pain is primarily localized to the dorsal aspect of the foot over the first MTP with the pain radiating laterally to the base of the other toes.  Pain is worse with walking.  Patient states he does not drink or consume shellfish.  He is not on hydrochlorothiazide or any other diuretics.  He does endorse red meat consumption.   Foot Pain       Home Medications Prior to Admission medications   Medication Sig Start Date End Date Taking? Authorizing Provider  naproxen (NAPROSYN) 500 MG tablet Take 1 tablet (500 mg total) by mouth 2 (two) times daily. 01/06/23  Yes Meredeth Ide, Sherrel Ploch M, PA-C  amLODipine (NORVASC) 10 MG tablet Take 1 tablet by mouth once daily 11/09/22   Garnette Gunner, MD  lisinopril (ZESTRIL) 10 MG tablet Take 1 tablet by mouth once daily 11/09/22   Garnette Gunner, MD  ondansetron (ZOFRAN) 4 MG tablet Take 1 tablet (4 mg total) by mouth every 8 (eight) hours as needed for nausea or vomiting. 11/18/22   Garnette Gunner, MD  pantoprazole (PROTONIX) 20 MG tablet TAKE 1 TABLET BY MOUTH ONCE DAILY AS NEEDED FOR  HEARTBURN  OR  INDIGESTION. 11/09/22   Garnette Gunner, MD  tirzepatide [facility]) 5 MG/0.5ML Pen Inject 5 mg into the skin once a week. Start 30 days after completing 2.5 mg injection 11/18/22 01/17/23  Garnette Gunner, MD  valACYclovir (VALTREX) 1000 MG tablet Take 1 tablet (1,000 mg total) by mouth 3 (three) times daily. Patient not taking: Reported on 08/17/2022 11/26/18    Pricilla Loveless, MD      Allergies    Patient has no known allergies.    Review of Systems   Review of Systems  All other systems reviewed and are negative.   Physical Exam Updated Vital Signs BP (!) 138/104 (BP Location: Left Arm)   Pulse 99   Temp 97.8 F (36.6 C) (Oral)   Resp 16   Ht 5\' 8"  (1.727 m)   Wt 123.4 kg   SpO2 100%   BMI 41.36 kg/m  Physical Exam Vitals and nursing note reviewed.  Constitutional:      Appearance: Normal appearance.  HENT:     Head: Normocephalic and atraumatic.  Eyes:     General:        Right eye: No discharge.        Left eye: No discharge.     Conjunctiva/sclera: Conjunctivae normal.  Pulmonary:     Effort: Pulmonary effort is normal.  Musculoskeletal:     Comments: There is tenderness to the left MTP.  There is some associated swelling but there is no erythema or warmth.  Skin:    General: Skin is warm and dry.     Findings: No rash.  Neurological:     General: No focal deficit present.     Mental Status: He is alert.  Psychiatric:  Mood and Affect: Mood normal.        Behavior: Behavior normal.     ED Results / Procedures / Treatments   Labs (all labs ordered are listed, but only abnormal results are displayed) Labs Reviewed - No data to display  EKG None  Radiology No results found.  Procedures Procedures    Medications Ordered in ED Medications  naproxen (NAPROSYN) tablet 500 mg (500 mg Oral Given 01/06/23 1416)    ED Course/ Medical Decision Making/ A&P Clinical Course as of 01/13/23 0905  Thu Jan 06, 2023  1435 DG Foot Complete Left Personally ordered and interpreted the study and do not see any evidence of fracture.  I do agree with radiologist interpretation. [CF]    Clinical Course User Index [CF] Teressa Lower, PA-C   {   Click here for ABCD2, HEART and other calculators  Medical Decision Making Henry Sanchez is a 47 y.o. male patient who presents to the emergency department today  for further evaluation of left-sided foot pain.  Clinically, this does fit gout although there is no record of synovial joint aspiration.  Will plan to get x-ray likely treat conservatively for gout.  Prescribed him naproxen for pain and he will follow up with his primary care doctor for further evaluation. Strict return precautions given. He is safe for discharge.   Amount and/or Complexity of Data Reviewed Radiology: ordered. Decision-making details documented in ED Course.  Risk Prescription drug management.    Final Clinical Impression(s) / ED Diagnoses Final diagnoses:  Foot pain, left    Rx / DC Orders ED Discharge Orders          Ordered    naproxen (NAPROSYN) 500 MG tablet  2 times daily        01/06/23 1444              Honor Loh [place], [place] 01/13/23 6962    Alvira Monday, MD 01/14/23 2143

## 2023-01-06 NOTE — Discharge Instructions (Signed)
Please continue taking naproxen which I will prescribe for you.  Please drink plenty of fluids.  You can also ice the foot as well.  Will return to the emergency room for any worsening symptoms.

## 2023-01-27 ENCOUNTER — Encounter (INDEPENDENT_AMBULATORY_CARE_PROVIDER_SITE_OTHER): Payer: Self-pay

## 2023-02-11 ENCOUNTER — Other Ambulatory Visit: Payer: Self-pay | Admitting: Family Medicine

## 2023-02-11 DIAGNOSIS — K219 Gastro-esophageal reflux disease without esophagitis: Secondary | ICD-10-CM

## 2023-02-11 DIAGNOSIS — E1159 Type 2 diabetes mellitus with other circulatory complications: Secondary | ICD-10-CM

## 2023-02-11 DIAGNOSIS — I152 Hypertension secondary to endocrine disorders: Secondary | ICD-10-CM

## 2023-02-11 DIAGNOSIS — E119 Type 2 diabetes mellitus without complications: Secondary | ICD-10-CM

## 2023-02-22 ENCOUNTER — Ambulatory Visit: Payer: BC Managed Care – PPO | Admitting: Family Medicine

## 2023-03-06 ENCOUNTER — Other Ambulatory Visit: Payer: Self-pay | Admitting: Family Medicine

## 2023-03-06 DIAGNOSIS — E119 Type 2 diabetes mellitus without complications: Secondary | ICD-10-CM

## 2023-03-15 ENCOUNTER — Encounter: Payer: Self-pay | Admitting: Family Medicine

## 2023-03-15 ENCOUNTER — Ambulatory Visit: Payer: BC Managed Care – PPO | Admitting: Family Medicine

## 2023-03-15 VITALS — BP 128/86 | HR 94 | Temp 97.8°F | Wt 266.2 lb

## 2023-03-15 DIAGNOSIS — M79672 Pain in left foot: Secondary | ICD-10-CM | POA: Diagnosis not present

## 2023-03-15 DIAGNOSIS — E119 Type 2 diabetes mellitus without complications: Secondary | ICD-10-CM

## 2023-03-15 DIAGNOSIS — Z1159 Encounter for screening for other viral diseases: Secondary | ICD-10-CM

## 2023-03-15 DIAGNOSIS — E1169 Type 2 diabetes mellitus with other specified complication: Secondary | ICD-10-CM | POA: Diagnosis not present

## 2023-03-15 DIAGNOSIS — E785 Hyperlipidemia, unspecified: Secondary | ICD-10-CM

## 2023-03-15 DIAGNOSIS — E1159 Type 2 diabetes mellitus with other circulatory complications: Secondary | ICD-10-CM | POA: Diagnosis not present

## 2023-03-15 DIAGNOSIS — I152 Hypertension secondary to endocrine disorders: Secondary | ICD-10-CM

## 2023-03-15 DIAGNOSIS — Z7985 Long-term (current) use of injectable non-insulin antidiabetic drugs: Secondary | ICD-10-CM | POA: Diagnosis not present

## 2023-03-15 DIAGNOSIS — R8281 Pyuria: Secondary | ICD-10-CM

## 2023-03-15 DIAGNOSIS — Z6841 Body Mass Index (BMI) 40.0 and over, adult: Secondary | ICD-10-CM

## 2023-03-15 DIAGNOSIS — F5101 Primary insomnia: Secondary | ICD-10-CM

## 2023-03-15 DIAGNOSIS — E66813 Obesity, class 3: Secondary | ICD-10-CM | POA: Diagnosis not present

## 2023-03-15 LAB — COMPREHENSIVE METABOLIC PANEL
ALT: 34 U/L (ref 0–53)
AST: 24 U/L (ref 0–37)
Albumin: 4.2 g/dL (ref 3.5–5.2)
Alkaline Phosphatase: 80 U/L (ref 39–117)
BUN: 13 mg/dL (ref 6–23)
CO2: 29 meq/L (ref 19–32)
Calcium: 9.6 mg/dL (ref 8.4–10.5)
Chloride: 102 meq/L (ref 96–112)
Creatinine, Ser: 1.12 mg/dL (ref 0.40–1.50)
GFR: 79.65 mL/min (ref >60.00)
Glucose, Bld: 101 mg/dL — ABNORMAL HIGH (ref 70–99)
Potassium: 4.1 meq/L (ref 3.5–5.1)
Sodium: 138 meq/L (ref 135–145)
Total Bilirubin: 0.4 mg/dL (ref 0.2–1.2)
Total Protein: 7.5 g/dL (ref 6.0–8.3)

## 2023-03-15 LAB — LIPID PANEL
Cholesterol: 164 mg/dL (ref 0–200)
HDL: 38.6 mg/dL — ABNORMAL LOW (ref >39.00)
LDL Cholesterol: 110 mg/dL — ABNORMAL HIGH (ref 0–99)
NonHDL: 125.85
Total CHOL/HDL Ratio: 4
Triglycerides: 79 mg/dL (ref 0.0–149.0)
VLDL: 15.8 mg/dL (ref 0.0–40.0)

## 2023-03-15 LAB — CBC WITH DIFFERENTIAL/PLATELET
Basophils Absolute: 0 10*3/uL (ref 0.0–0.1)
Basophils Relative: 0.2 % (ref 0.0–3.0)
Eosinophils Absolute: 0.1 10*3/uL (ref 0.0–0.7)
Eosinophils Relative: 1.7 % (ref 0.0–5.0)
HCT: 48.3 % (ref 39.0–52.0)
Hemoglobin: 15.6 g/dL (ref 13.0–17.0)
Lymphocytes Relative: 40.6 % (ref 12.0–46.0)
Lymphs Abs: 1.9 10*3/uL (ref 0.7–4.0)
MCHC: 32.2 g/dL (ref 30.0–36.0)
MCV: 85.3 fL (ref 78.0–100.0)
Monocytes Absolute: 0.6 10*3/uL (ref 0.1–1.0)
Monocytes Relative: 13.1 % — ABNORMAL HIGH (ref 3.0–12.0)
Neutro Abs: 2.1 10*3/uL (ref 1.4–7.7)
Neutrophils Relative %: 44.4 % (ref 43.0–77.0)
Platelets: 206 10*3/uL (ref 150.0–400.0)
RBC: 5.66 Mil/uL (ref 4.22–5.81)
RDW: 14.6 % (ref 11.5–15.5)
WBC: 4.6 10*3/uL (ref 4.0–10.5)

## 2023-03-15 LAB — MICROALBUMIN / CREATININE URINE RATIO
Creatinine,U: 198.5 mg/dL
Microalb Creat Ratio: 1.5 mg/g (ref 0.0–30.0)
Microalb, Ur: 3 mg/dL — ABNORMAL HIGH (ref 0.0–1.9)

## 2023-03-15 LAB — TSH: TSH: 0.81 u[IU]/mL (ref 0.35–5.50)

## 2023-03-15 LAB — POCT GLYCOSYLATED HEMOGLOBIN (HGB A1C): Hemoglobin A1C: 6.3 % — AB (ref 4.0–5.6)

## 2023-03-15 LAB — HEMOGLOBIN A1C: Hgb A1c MFr Bld: 6.4 % (ref 4.6–6.5)

## 2023-03-15 LAB — URIC ACID: Uric Acid, Serum: 7.1 mg/dL (ref 4.0–7.8)

## 2023-03-15 MED ORDER — TIRZEPATIDE 7.5 MG/0.5ML ~~LOC~~ SOAJ
7.5000 mg | SUBCUTANEOUS | 0 refills | Status: DC
Start: 2023-03-15 — End: 2023-05-11

## 2023-03-15 MED ORDER — TRAZODONE HCL 50 MG PO TABS
25.0000 mg | ORAL_TABLET | Freq: Every evening | ORAL | 3 refills | Status: DC | PRN
Start: 2023-03-15 — End: 2023-10-07

## 2023-03-15 NOTE — Assessment & Plan Note (Signed)
Hypertension above goal less than 130 however 80 on current regimen.  Plan:  Continue Amlodipine 10 mg daily. Continue Lisinopril 10 mg daily. Continue to monitor with weight loss

## 2023-03-15 NOTE — Patient Instructions (Addendum)
Continue taking your prescribed Naproxen as directed for any signs of inflammation or pain in your foot. Monitor your blood pressure at home regularly and keep a record to discuss at your next appointment. Schedule a fasting blood test 1 week before your next visit to check cholesterol and uric acid levels. Start taking Trazodone, 25 to 50 mg as needed, to help with sleep disturbances. We are referring for sleep study.  Increase your dose of Mounjaro to 7.5 mg and monitor for any changes or side effects related to your blood sugar and weight management.

## 2023-03-15 NOTE — Assessment & Plan Note (Signed)
Well-controlled, HbA1c improved to 6.3%.  Plan: Increase Mounjaro to 7.5 mg. Monitor for any GI side effects or hypoglycemia. Continue regular follow-ups.

## 2023-03-15 NOTE — Assessment & Plan Note (Signed)
Likely related to existing high BMI and conditions contributing to sleep apnea. Potentially side effect of Mounjaro, however this would be an uncommon.  Plan: Conduct a sleep study. Initiate trazodone 25-50 mg as needed for sleep. Continue tracking sleep patterns.

## 2023-03-15 NOTE — Assessment & Plan Note (Signed)
Resolved with naproxen, no current symptoms.  Plan: Check uric acid levels with next blood panel.

## 2023-03-15 NOTE — Progress Notes (Signed)
Assessment/Plan:   Problem List Items Addressed This Visit       Cardiovascular and Mediastinum   Hypertension associated with diabetes (HCC)    Hypertension above goal less than 130 however 80 on current regimen.  Plan:  Continue Amlodipine 10 mg daily. Continue Lisinopril 10 mg daily. Continue to monitor with weight loss      Relevant Medications   tirzepatide (MOUNJARO) 7.5 MG/0.5ML Pen     Endocrine   Dyslipidemia associated with type 2 diabetes mellitus (HCC) - Primary    Well-controlled, HbA1c improved to 6.3%.  Plan: Increase Mounjaro to 7.5 mg. Monitor for any GI side effects or hypoglycemia. Continue regular follow-ups.      Relevant Medications   tirzepatide (MOUNJARO) 7.5 MG/0.5ML Pen   Other Relevant Orders   TSH   Lipid panel     Other   Class 3 severe obesity with serious comorbidity in adult [facility])   Relevant Medications   tirzepatide (MOUNJARO) 7.5 MG/0.5ML Pen   Left foot pain    Resolved with naproxen, no current symptoms.  Plan: Check uric acid levels with next blood panel.      Relevant Orders   Uric acid   Primary insomnia    Likely related to existing high BMI and conditions contributing to sleep apnea. Potentially side effect of Mounjaro, however this would be an uncommon.  Plan: Conduct a sleep study. Initiate trazodone 25-50 mg as needed for sleep. Continue tracking sleep patterns.      Relevant Medications   traZODone (DESYREL) 50 MG tablet   Other Relevant Orders   Ambulatory referral to Sleep Studies   Other Visit Diagnoses     Screening for viral disease       Relevant Orders   HIV antibody (with reflex)       Medications Discontinued During This Encounter  Medication Reason   MOUNJARO 5 MG/0.5ML Pen     Return in about 3 months (around 06/15/2023) for BP, DM, HLD, fasting labs 1 week before apt.    Subjective:   Encounter date: 03/15/2023  Henry Sanchez is a 47 y.o. male who has GERD (gastroesophageal  reflux disease); Bell palsy; Dyslipidemia associated with type 2 diabetes mellitus (HCC); Hypertension associated with diabetes (HCC); Class 3 severe obesity with serious comorbidity in adult [facility]); Left foot pain; and Primary insomnia on their problem list..   He  has a past medical history of Diabetes mellitus without complication (HCC) and Hypertension..   Chief Complaint: Follow-up for diabetes management, weight management, and blood pressure control. Patient also reports recent ER visit for foot pain, possible gout, and sleep disturbances.  History of Present Illness:  Diabetes Mellitus: Patient's HbA1c has improved, currently at 6.3%, down from 6.6%. The patient is currently on Mounjaro 5 mg with no reported gastrointestinal side effects, hypoglycemia, hyperglycemia, or excessive thirst. Noted weight loss from 272 lbs to 266 lbs.   Foot Pain/Gout: Patient visited the ER in July for left big toe pain. There was uncertainty whether it was gout; however, the patient was treated with naproxen, resulting in symptom relief. An X-ray of the left foot was negative for abnormalities.   Sleep Disturbance: Patient reports difficulty sleeping since starting Mounjaro, though it's not consistent every night. Episodes described as feeling well-rested after very short durations of sleep. No known cause determined. The physician suggests a sleep study to rule out sleep apnea given the patient's high BMI and existing conditions. Initiate trazodone 25-50 mg as needed for sleep.  Review of Systems  Constitutional:  Negative for chills, diaphoresis, fever, malaise/fatigue and weight loss.  HENT:  Negative for congestion, ear discharge, ear pain and hearing loss.   Eyes:  Negative for blurred vision, double vision, photophobia, pain, discharge and redness.  Respiratory:  Negative for cough, sputum production, shortness of breath and wheezing.   Cardiovascular:  Negative for chest pain and palpitations.   Gastrointestinal:  Negative for abdominal pain, blood in stool, constipation, diarrhea, heartburn, melena, nausea and vomiting.  Genitourinary:  Negative for dysuria, flank pain, frequency, hematuria and urgency.  Musculoskeletal:  Positive for joint pain. Negative for myalgias.       Left foot  Skin:  Negative for itching and rash.  Neurological:  Negative for dizziness, tingling, tremors, speech change, seizures, loss of consciousness, weakness and headaches.  Endo/Heme/Allergies:  Negative for polydipsia.  Psychiatric/Behavioral:  Negative for depression, hallucinations, memory loss, substance abuse and suicidal ideas. The patient has insomnia. The patient is not nervous/anxious.   All other systems reviewed and are negative.   No past surgical history on file.  Outpatient Medications Prior to Visit  Medication Sig Dispense Refill   amLODipine (NORVASC) 10 MG tablet Take 1 tablet by mouth once daily 90 tablet 0   lisinopril (ZESTRIL) 10 MG tablet Take 1 tablet by mouth once daily 90 tablet 0   naproxen (NAPROSYN) 500 MG tablet Take 1 tablet (500 mg total) by mouth 2 (two) times daily. 30 tablet 0   pantoprazole (PROTONIX) 20 MG tablet TAKE 1 TABLET BY MOUTH ONCE DAILY AS NEEDED FOR  HEARTBURN  OR  INDIGESTION. 90 tablet 0   valACYclovir (VALTREX) 1000 MG tablet Take 1 tablet (1,000 mg total) by mouth 3 (three) times daily. 20 tablet 0   MOUNJARO 5 MG/0.5ML Pen INJECT 5MG  SUBCUTANEOUSLY ONCE A WEEK. START 30 DAYS AFTER COMPLETING 2.5MG  INJECTION. 4 mL 0   ondansetron (ZOFRAN) 4 MG tablet Take 1 tablet (4 mg total) by mouth every 8 (eight) hours as needed for nausea or vomiting. (Patient not taking: Reported on 03/15/2023) 20 tablet 0   No facility-administered medications prior to visit.    Family History  Problem Relation Age of Onset   Breast cancer Mother     Social History   Socioeconomic History   Marital status: Single    Spouse name: Not on file   Number of children: Not  on file   Years of education: Not on file   Highest education level: Not on file  Occupational History   Not on file  Tobacco Use   Smoking status: Never    Passive exposure: Never   Smokeless tobacco: Never  Vaping Use   Vaping status: Never Used  Substance and Sexual Activity   Alcohol use: Never   Drug use: No   Sexual activity: Not on file  Other Topics Concern   Not on file  Social History Narrative   Not on file   Social Determinants of Health   Financial Resource Strain: Not on File (02/28/2019)   Received from [employer], [employer]    Financial Resource Strain: 0  Food Insecurity: Not on File (02/28/2019)   Received from [place], [place]   Food Insecurity    Food: 0  Transportation Needs: Not on File (02/28/2019)   Received from [place], [employer] Needs    Transportation: 0  Physical Activity: Not on File (02/28/2019)   Received from [place], [place]   Physical Activity  Physical Activity: 0  Stress: Not on File (02/28/2019)   Received from [place], [place]   Stress    Stress: 0  Social Connections: Not on File (02/28/2019)   Received from [place], [place]   Social Connections    Social Connections and Isolation: 0  Intimate Partner Violence: Not on file                                                                                                  Objective:  Physical Exam: BP 128/86 (BP Location: Left Arm, Patient Position: Sitting, Cuff Size: Large)   Pulse 94   Temp 97.8 F (36.6 C) (Temporal)   Wt 266 lb 3.2 oz (120.7 kg)   SpO2 98%   BMI 40.48 kg/m   Wt Readings from Last 3 Encounters:  03/15/23 266 lb 3.2 oz (120.7 kg)  01/06/23 272 lb (123.4 kg)  11/18/22 272 lb (123.4 kg)     Physical Exam Constitutional:      Appearance: Normal appearance.  HENT:     Head: Normocephalic and atraumatic.     Right Ear: Hearing normal.     Left Ear: Hearing normal.     Nose: Nose normal.  Eyes:     General: No scleral icterus.        Right eye: No discharge.        Left eye: No discharge.     Extraocular Movements: Extraocular movements intact.  Cardiovascular:     Rate and Rhythm: Normal rate and regular rhythm.     Heart sounds: Normal heart sounds.  Pulmonary:     Effort: Pulmonary effort is normal.     Breath sounds: Normal breath sounds.  Abdominal:     Palpations: Abdomen is soft.     Tenderness: There is no abdominal tenderness.  Skin:    General: Skin is warm.     Findings: No rash.  Neurological:     General: No focal deficit present.     Mental Status: He is alert.     Cranial Nerves: No cranial nerve deficit.  Psychiatric:        Mood and Affect: Mood normal.        Behavior: Behavior normal.        Thought Content: Thought content normal.        Judgment: Judgment normal.     DG Foot Complete Left  Result Date: 01/06/2023 CLINICAL DATA:  144615 Pain 144615 EXAM: LEFT FOOT - COMPLETE 3+ VIEW COMPARISON:  None Available. FINDINGS: Normal alignment. No acute fracture. Small calcaneal enthesophytes. Normal mineralization. The soft tissues are unremarkable. IMPRESSION: No acute osseous abnormality. Electronically Signed   By: Olive Bass M.D.   On: 01/06/2023 14:15    Recent Results (from the past 2160 hour(s))  POCT glycosylated hemoglobin (Hb A1C)     Status: Abnormal   Collection Time: 03/15/23  8:30 AM  Result Value Ref Range   Hemoglobin A1C 6.3 (A) 4.0 - 5.6 %   HbA1c POC (<> result, manual entry)     HbA1c, POC (prediabetic range)     HbA1c,  POC (controlled diabetic range)          Garner Nash, MD, MS

## 2023-03-17 LAB — URINALYSIS W MICROSCOPIC + REFLEX CULTURE
Bacteria, UA: NONE SEEN /HPF
Bilirubin Urine: NEGATIVE
Glucose, UA: NEGATIVE
Hgb urine dipstick: NEGATIVE
Hyaline Cast: NONE SEEN /LPF
Ketones, ur: NEGATIVE
Leukocyte Esterase: NEGATIVE
Nitrites, Initial: NEGATIVE
RBC / HPF: NONE SEEN /HPF (ref 0–2)
Specific Gravity, Urine: 1.021 (ref 1.001–1.035)
Squamous Epithelial / HPF: NONE SEEN /HPF (ref <=5)
pH: 6 (ref 5.0–8.0)

## 2023-03-17 LAB — URINE CULTURE
MICRO NUMBER:: 15538830
Result:: NO GROWTH
SPECIMEN QUALITY:: ADEQUATE

## 2023-03-17 LAB — HIV ANTIBODY (ROUTINE TESTING W REFLEX): HIV 1&2 Ab, 4th Generation: NONREACTIVE

## 2023-03-17 LAB — CULTURE INDICATED

## 2023-03-17 NOTE — Addendum Note (Signed)
Addended by: Garnette Gunner on: 03/17/2023 08:39 AM   Modules accepted: Orders

## 2023-05-11 ENCOUNTER — Telehealth: Payer: Self-pay

## 2023-05-11 ENCOUNTER — Other Ambulatory Visit: Payer: Self-pay

## 2023-05-11 DIAGNOSIS — E119 Type 2 diabetes mellitus without complications: Secondary | ICD-10-CM

## 2023-05-11 MED ORDER — TIRZEPATIDE 7.5 MG/0.5ML ~~LOC~~ SOAJ: 7.5000 mg | SUBCUTANEOUS | 0 refills | Status: DC

## 2023-05-11 NOTE — Telephone Encounter (Signed)
-----   Message from Temitope O sent at 05/11/2023  2:12 PM EST ----- Please abstract and route to provider.

## 2023-05-11 NOTE — Telephone Encounter (Signed)
Received e-fax for refill of mounjaro 7.5. Rx sent into [facility] [address] [facility] in [place] as requested.

## 2023-05-17 ENCOUNTER — Other Ambulatory Visit (HOSPITAL_COMMUNITY): Payer: Self-pay

## 2023-05-18 ENCOUNTER — Other Ambulatory Visit: Payer: Self-pay | Admitting: Family Medicine

## 2023-05-18 DIAGNOSIS — E1159 Type 2 diabetes mellitus with other circulatory complications: Secondary | ICD-10-CM

## 2023-05-18 DIAGNOSIS — E119 Type 2 diabetes mellitus without complications: Secondary | ICD-10-CM

## 2023-05-19 ENCOUNTER — Telehealth: Payer: Self-pay

## 2023-05-19 ENCOUNTER — Other Ambulatory Visit (HOSPITAL_COMMUNITY): Payer: Self-pay

## 2023-05-19 NOTE — Telephone Encounter (Signed)
Received fax request from covermymeds requesting PA for Mounjaro 7.5 mg. Please advise.

## 2023-05-20 ENCOUNTER — Other Ambulatory Visit (HOSPITAL_COMMUNITY): Payer: Self-pay

## 2023-05-20 ENCOUNTER — Telehealth: Payer: Self-pay

## 2023-05-20 NOTE — Telephone Encounter (Addendum)
Pharmacy Patient Advocate Encounter  Received notification from [facility] that Prior Authorization for Mounjaro 7.5mg /0.43ml has been CANCELLED due to [facility] is available without authorization.  Per test claim, refill too soon,  next fill 06/07/23. The insurance will only allow 1 month at a time.

## 2023-05-20 NOTE — Telephone Encounter (Signed)
Pharmacy Patient Advocate Encounter   Received notification from Pt Calls Messages that prior authorization for Mounjaro 7.5mg /0.20ml is required/requested.   Insurance verification completed.   The patient is insured through [employer] .   Per test claim: PA required; PA submitted to above mentioned insurance via CoverMyMeds Key/confirmation #/EOC [id number] Status is pending

## 2023-06-09 ENCOUNTER — Other Ambulatory Visit: Payer: BC Managed Care – PPO

## 2023-06-16 ENCOUNTER — Ambulatory Visit: Payer: BC Managed Care – PPO | Admitting: Family Medicine

## 2023-07-18 ENCOUNTER — Telehealth: Payer: Self-pay | Admitting: Family Medicine

## 2023-07-18 ENCOUNTER — Ambulatory Visit: Payer: BC Managed Care – PPO | Admitting: Family Medicine

## 2023-07-19 ENCOUNTER — Emergency Department (HOSPITAL_BASED_OUTPATIENT_CLINIC_OR_DEPARTMENT_OTHER)
Admission: EM | Admit: 2023-07-19 | Discharge: 2023-07-20 | Disposition: A | Discharge status: Home / Self Care | Payer: BC Managed Care – PPO | Attending: Emergency Medicine | Admitting: Emergency Medicine

## 2023-07-19 ENCOUNTER — Other Ambulatory Visit: Payer: Self-pay

## 2023-07-19 ENCOUNTER — Encounter (HOSPITAL_BASED_OUTPATIENT_CLINIC_OR_DEPARTMENT_OTHER): Payer: Self-pay | Admitting: Emergency Medicine

## 2023-07-19 ENCOUNTER — Emergency Department (HOSPITAL_BASED_OUTPATIENT_CLINIC_OR_DEPARTMENT_OTHER): Payer: BC Managed Care – PPO

## 2023-07-19 DIAGNOSIS — S93491A Sprain of other ligament of right ankle, initial encounter: Secondary | ICD-10-CM | POA: Diagnosis not present

## 2023-07-19 DIAGNOSIS — I10 Essential (primary) hypertension: Secondary | ICD-10-CM | POA: Diagnosis not present

## 2023-07-19 DIAGNOSIS — S93411A Sprain of calcaneofibular ligament of right ankle, initial encounter: Secondary | ICD-10-CM | POA: Diagnosis not present

## 2023-07-19 DIAGNOSIS — E119 Type 2 diabetes mellitus without complications: Secondary | ICD-10-CM | POA: Insufficient documentation

## 2023-07-19 DIAGNOSIS — Z79899 Other long term (current) drug therapy: Secondary | ICD-10-CM | POA: Diagnosis not present

## 2023-07-19 DIAGNOSIS — W108XXA Fall (on) (from) other stairs and steps, initial encounter: Secondary | ICD-10-CM | POA: Insufficient documentation

## 2023-07-19 DIAGNOSIS — M7989 Other specified soft tissue disorders: Secondary | ICD-10-CM | POA: Diagnosis not present

## 2023-07-19 DIAGNOSIS — S92214A Nondisplaced fracture of cuboid bone of right foot, initial encounter for closed fracture: Secondary | ICD-10-CM | POA: Insufficient documentation

## 2023-07-19 DIAGNOSIS — S99911A Unspecified injury of right ankle, initial encounter: Secondary | ICD-10-CM | POA: Diagnosis not present

## 2023-07-19 DIAGNOSIS — M25571 Pain in right ankle and joints of right foot: Secondary | ICD-10-CM | POA: Insufficient documentation

## 2023-07-19 NOTE — ED Triage Notes (Addendum)
Pt fell down steps this am, injured right foot/ankle was not feeling too bad this morning but through out the day has gotten worse.

## 2023-07-20 DIAGNOSIS — S93491A Sprain of other ligament of right ankle, initial encounter: Secondary | ICD-10-CM | POA: Diagnosis not present

## 2023-07-20 MED ORDER — OXYCODONE HCL 5 MG PO TABS: 5.0000 mg | ORAL_TABLET | Freq: Four times a day (QID) | ORAL | 0 refills | Status: DC | PRN

## 2023-07-20 MED ORDER — IBUPROFEN 800 MG PO TABS
800.0000 mg | ORAL_TABLET | Freq: Once | ORAL | Status: AC
  Administered 2023-07-20: 800 mg via ORAL
  Filled 2023-07-20: qty 1

## 2023-07-20 MED ORDER — IBUPROFEN 600 MG PO TABS: 600.0000 mg | ORAL_TABLET | Freq: Four times a day (QID) | ORAL | 0 refills | Status: DC | PRN

## 2023-07-20 MED ORDER — ACETAMINOPHEN 325 MG PO TABS: 650.0000 mg | ORAL_TABLET | Freq: Four times a day (QID) | ORAL | 0 refills | Status: DC | PRN

## 2023-07-20 MED ORDER — ACETAMINOPHEN 500 MG PO TABS
1000.0000 mg | ORAL_TABLET | Freq: Once | ORAL | Status: AC
Start: 2023-07-20 — End: 2023-07-20
  Administered 2023-07-20: 1000 mg via ORAL
  Filled 2023-07-20: qty 2

## 2023-07-20 NOTE — Discharge Instructions (Addendum)
 It was a pleasure caring for you today in the emergency department.  Please follow up with orthopedics   Please wear splint and use crutches until cleared orthopedics.  Nonweightbearing to right leg until cleared orthopedics.  If you have worsening pain to your foot or ankle, discoloration to your toes or foot, loss of sensation to your toes or foot.  Or any worsening / worrisome symptoms please return to the ER for evaluation.

## 2023-07-20 NOTE — ED Provider Notes (Signed)
 [place] [facility] Provider Note  CSN: [id number] Arrival date & time: 07/19/23 2028  Chief Complaint(s) Foot Injury  HPI Parker Wherley is a 48 y.o. male with past medical history as below, significant for DM HTN HLD obesity who presents to the ED with complaint of fall/r ankle injury  Patient works this morning that he slipped on a step and fell down.  His ankle rolled inwards.  No head injury, no LOC.  No pain to knee or hip ipsilateral.  He was ambulatory after the event.  Pain is worsened throughout the day.  Difficulty walking secondary to the worsening pain.  No medication prior to arrival.  No thinners.  No head injury, no LOC  Past Medical History Past Medical History:  Diagnosis Date   Diabetes mellitus without complication (HCC)    Hypertension    Patient Active Problem List   Diagnosis Date Noted   Left foot pain 03/15/2023   Primary insomnia 03/15/2023   Dyslipidemia associated with type 2 diabetes mellitus (HCC) 08/17/2022   Hypertension associated with diabetes (HCC) 08/17/2022   Class 3 severe obesity with serious comorbidity in adult [facility]) 08/17/2022   Bell palsy 12/06/2018   GERD (gastroesophageal reflux disease) 08/31/2017   Home Medication(s) Prior to Admission medications   Medication Sig Start Date End Date Taking? Authorizing Provider  acetaminophen  (TYLENOL ) 325 MG tablet Take 2 tablets (650 mg total) by mouth every 6 (six) hours as needed. 07/20/23  Yes Elnor Savant A, DO  ibuprofen  (ADVIL ) 600 MG tablet Take 1 tablet (600 mg total) by mouth every 6 (six) hours as needed. 07/20/23  Yes Elnor Savant A, DO  oxyCODONE  (ROXICODONE ) 5 MG immediate release tablet Take 1 tablet (5 mg total) by mouth every 6 (six) hours as needed for severe pain (pain score 7-10). 07/20/23  Yes Elnor Savant A, DO  amLODipine  (NORVASC ) 10 MG tablet Take 1 tablet by mouth once daily 05/18/23   Sebastian Beverley NOVAK, MD  lisinopril  (ZESTRIL ) 10 MG tablet  Take 1 tablet by mouth once daily 05/18/23   Sebastian Beverley NOVAK, MD  naproxen  (NAPROSYN ) 500 MG tablet Take 1 tablet (500 mg total) by mouth 2 (two) times daily. 01/06/23   Theotis Peers M, PA-C  ondansetron  (ZOFRAN ) 4 MG tablet Take 1 tablet (4 mg total) by mouth every 8 (eight) hours as needed for nausea or vomiting. Patient not taking: Reported on 03/15/2023 11/18/22   Sebastian Beverley NOVAK, MD  pantoprazole  (PROTONIX ) 20 MG tablet TAKE 1 TABLET BY MOUTH ONCE DAILY AS NEEDED FOR  HEARTBURN  OR  INDIGESTION. 02/11/23   Sebastian Beverley NOVAK, MD  tirzepatide  (MOUNJARO ) 7.5 MG/0.5ML Pen Inject 7.5 mg into the skin once a week. 05/11/23 08/09/23  Sebastian Beverley NOVAK, MD  traZODone  (DESYREL ) 50 MG tablet Take 0.5-1 tablets (25-50 mg total) by mouth at bedtime as needed for sleep. 03/15/23   Sebastian Beverley NOVAK, MD  valACYclovir  (VALTREX ) 1000 MG tablet Take 1 tablet (1,000 mg total) by mouth 3 (three) times daily. 11/26/18   Freddi Hamilton, MD  Past Surgical History History reviewed. No pertinent surgical history. Family History Family History  Problem Relation Age of Onset   Breast cancer Mother     Social History Social History   Tobacco Use   Smoking status: Never    Passive exposure: Never   Smokeless tobacco: Never  Vaping Use   Vaping status: Never Used  Substance Use Topics   Alcohol use: Never   Drug use: No   Allergies Patient has no known allergies.  Review of Systems Review of Systems  Respiratory:  Negative for chest tightness and shortness of breath.   Cardiovascular:  Negative for chest pain.  Gastrointestinal:  Negative for abdominal pain.  Musculoskeletal:  Positive for arthralgias and gait problem.  Neurological:  Negative for syncope.  All other systems reviewed and are negative.   Physical Exam Vital Signs  I have reviewed the triage vital  signs BP 124/89 (BP Location: Left Arm)   Pulse (!) 111   Temp 99.2 F (37.3 C)   Resp 18   Ht 5' 1 (1.549 m)   Wt 122.5 kg   SpO2 97%   BMI 51.02 kg/m  Physical Exam Vitals and nursing note reviewed.  Constitutional:      General: He is not in acute distress.    Appearance: Normal appearance. He is well-developed. He is not ill-appearing.  HENT:     Head: Normocephalic and atraumatic.     Right Ear: External ear normal.     Left Ear: External ear normal.     Nose: Nose normal.     Mouth/Throat:     Mouth: Mucous membranes are moist.  Eyes:     General: No scleral icterus.       Right eye: No discharge.        Left eye: No discharge.  Cardiovascular:     Rate and Rhythm: Tachycardia present.  Pulmonary:     Effort: Pulmonary effort is normal. No respiratory distress.     Breath sounds: No stridor.  Abdominal:     General: Abdomen is flat. There is no distension.     Tenderness: There is no guarding.  Musculoskeletal:        General: No deformity.     Cervical back: No rigidity.       Feet:  Feet:     Comments: Achilles tendon intact bilateral.  No pain with squeeze test.  DP pulse intact.  PT pulse intact.  Sensation intact distally right foot. Skin:    General: Skin is warm and dry.     Coloration: Skin is not cyanotic, jaundiced or pale.  Neurological:     Mental Status: He is alert and oriented to person, place, and time.     GCS: GCS eye subscore is 4. GCS verbal subscore is 5. GCS motor subscore is 6.  Psychiatric:        Speech: Speech normal.        Behavior: Behavior normal. Behavior is cooperative.     ED Results and Treatments Labs (all labs ordered are listed, but only abnormal results are displayed) Labs Reviewed - No data to display  Radiology DG Ankle Complete Right Result Date: 07/19/2023 CLINICAL DATA:  Clemens with pain and  swelling EXAM: RIGHT ANKLE - COMPLETE 3+ VIEW COMPARISON:  None Available. FINDINGS: Probable avulsion fracture of the posterior corner of the cuboid bone. Question minor avulsion fracture also the calcaneus from calcaneofibular ligament injury. No other regional fracture IMPRESSION: Probable avulsion fracture of the posterior corner of the cuboid bone. Question minor avulsion fracture of the calcaneus from calcaneofibular ligament injury. Electronically Signed   By: Oneil Officer M.D.   On: 07/19/2023 22:07    Pertinent labs & imaging results that were available during my care of the patient were reviewed by me and considered in my medical decision making (see MDM for details).  Medications Ordered in ED Medications  acetaminophen  (TYLENOL ) tablet 1,000 mg (1,000 mg Oral Given 07/20/23 0010)  ibuprofen  (ADVIL ) tablet 800 mg (800 mg Oral Given 07/20/23 0010)                                                                                                                                     Procedures .Ortho Injury Treatment  Date/Time: 07/20/2023 12:10 AM  Performed by: Elnor Jayson LABOR, DO Authorized by: Elnor Jayson LABOR, DO   Consent:    Consent obtained:  Verbal   Consent given by:  Patient   Risks discussed:  Fracture   Alternatives discussed:  No treatment and alternative treatmentInjury location: foot Location details: right foot Injury type: fracture Fracture type: cuboid Pre-procedure neurovascular assessment: neurovascularly intact Pre-procedure distal perfusion: normal Pre-procedure neurological function: normal Pre-procedure range of motion: reduced  Anesthesia: Local anesthesia used: no  Patient sedated: NoManipulation performed: no Immobilization: splint Splint type: ankle stirrup and short leg Splint Applied by: ED Tech Supplies used: Ortho-Glass Post-procedure neurovascular assessment: post-procedure neurovascularly intact Post-procedure distal perfusion:  normal Post-procedure neurological function: normal Post-procedure range of motion: unchanged     (including critical care time)  Medical Decision Making / ED Course    Medical Decision Making:    Ved Martos is a 48 y.o. male with past medical history as below, significant for DM HTN HLD obesity who presents to the ED with complaint of fall/r ankle injury. The complaint involves an extensive differential diagnosis and also carries with it a high risk of complications and morbidity.  Serious etiology was considered. Ddx includes but is not limited to: Sprain, strain, dislocation, fracture, soft tissue, etc.  Complete initial physical exam performed, notably the patient was in no distress, sitting comfortably.  Heart rate is elevated in triage but did improve Reviewed and confirmed nursing documentation for past medical history, family history, social history.  Vital signs reviewed.       Brief summary: 48 year old male with history as above here with right ankle injury.  Fell from steps.  Injury to right foot and ankle, no other injury reported.  No thinners, no head injury.  X-ray revealing for avulsion fracture  of the cuboid and also possible avulsion fracture of the calcaneus at the calcaneofibular ligament.  Will place in splint, give crutches, nonweightbearing, analgesia for home.  Follow-up orthopedics.  The patient improved significantly and was discharged in stable condition. Detailed discussions were had with the patient/guardian regarding current findings, and need for close f/u with PCP or on call doctor. The patient/guardian has been instructed to return immediately if the symptoms worsen in any way for re-evaluation. Patient/guardian verbalized understanding and is in agreement with current care plan. All questions answered prior to discharge.            Additional history obtained: -Additional history obtained from na -External records from outside source  obtained and reviewed including: Chart review including previous notes, labs, imaging, consultation notes including  Prior ED visits, prior labs and imaging, home medications, PDMP   Lab Tests: na  EKG   EKG Interpretation Date/Time:    Ventricular Rate:    PR Interval:    QRS Duration:    QT Interval:    QTC Calculation:   R Axis:      Text Interpretation:           Imaging Studies ordered: I ordered imaging studies including xr ankle I independently visualized the following imaging with scope of interpretation limited to determining acute life threatening conditions related to emergency care; findings noted above I independently visualized and interpreted imaging. I agree with the radiologist interpretation   Medicines ordered and prescription drug management: Meds ordered this encounter  Medications   acetaminophen  (TYLENOL ) tablet 1,000 mg   ibuprofen  (ADVIL ) tablet 800 mg   ibuprofen  (ADVIL ) 600 MG tablet    Sig: Take 1 tablet (600 mg total) by mouth every 6 (six) hours as needed.    Dispense:  30 tablet    Refill:  0   acetaminophen  (TYLENOL ) 325 MG tablet    Sig: Take 2 tablets (650 mg total) by mouth every 6 (six) hours as needed.    Dispense:  36 tablet    Refill:  0   oxyCODONE  (ROXICODONE ) 5 MG immediate release tablet    Sig: Take 1 tablet (5 mg total) by mouth every 6 (six) hours as needed for severe pain (pain score 7-10).    Dispense:  10 tablet    Refill:  0    -I have reviewed the patients home medicines and have made adjustments as needed   Consultations Obtained: na   Cardiac Monitoring: Continuous pulse oximetry interpreted by myself, 98% on RA.    Social Determinants of Health:  Diagnosis or treatment significantly limited by social determinants of health: obesity   Reevaluation: After the interventions noted above, I reevaluated the patient and found that they have improved  Co morbidities that complicate the patient  evaluation  Past Medical History:  Diagnosis Date   Diabetes mellitus without complication (HCC)    Hypertension       Dispostion: Disposition decision including need for hospitalization was considered, and patient discharged from emergency department.    Final Clinical Impression(s) / ED Diagnoses Final diagnoses:  Closed nondisplaced fracture of cuboid of right foot, initial encounter  Sprain of calcaneofibular ligament of right ankle, initial encounter        Elnor Jayson LABOR, DO 07/20/23 0014

## 2023-07-20 NOTE — Telephone Encounter (Signed)
 07/18/2023 1st no show, letter sent via mychart, also sent text

## 2023-07-20 NOTE — Telephone Encounter (Signed)
 Noted.

## 2023-07-21 ENCOUNTER — Telehealth: Payer: Self-pay

## 2023-07-21 NOTE — Transitions of Care (Post Inpatient/ED Visit) (Signed)
   07/21/2023  Name: Elward Nocera MRN: [id number] DOB: 07/23/1975  Today's TOC FU Call Status: Today's TOC FU Call Status:: Successful TOC FU Call Completed TOC FU Call Complete Date: 07/21/23 Patient's Name and Date of Birth confirmed.  Transition Care Management Follow-up Telephone Call Date of Discharge: 07/20/23 Discharge Facility: [facility] Type of Discharge: Emergency Department How have you been since you were released from the hospital?: Better Any questions or concerns?: No  Items Reviewed: Did you receive and understand the discharge instructions provided?: Yes Any new allergies since your discharge?: No Dietary orders reviewed?: NA Do you have support at home?: Yes  Medications Reviewed Today: Medications Reviewed Today   Medications were not reviewed in this encounter     Home Care and Equipment/Supplies: Were Home Health Services Ordered?: NA Any new equipment or medical supplies ordered?: NA  Functional Questionnaire: Do you need assistance with bathing/showering or dressing?: No Do you need assistance with meal preparation?: No Do you need assistance with eating?: No Do you have difficulty maintaining continence: No Do you need assistance with getting out of bed/getting out of a chair/moving?: No Do you have difficulty managing or taking your medications?: No  Follow up appointments reviewed: PCP Follow-up appointment confirmed?: NA Specialist Hospital Follow-up appointment confirmed?: Yes Do you need transportation to your follow-up appointment?: No Do you understand care options if your condition(s) worsen?: Yes-patient verbalized understanding    SIGNATURE Dedra Sorenson, BSN, RN

## 2023-07-27 ENCOUNTER — Ambulatory Visit: Payer: BC Managed Care – PPO | Admitting: Orthopaedic Surgery

## 2023-07-27 ENCOUNTER — Encounter: Payer: Self-pay | Admitting: Orthopaedic Surgery

## 2023-07-27 DIAGNOSIS — S93431D Sprain of tibiofibular ligament of right ankle, subsequent encounter: Secondary | ICD-10-CM | POA: Diagnosis not present

## 2023-07-27 NOTE — Progress Notes (Signed)
The patient is a very pleasant 48 year old gentleman sent from emergency room after injuring his right ankle and foot last week.  He is a Optometrist.  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  He was seen at one of the outlying medical centers which was part of the [facility] and found to have some type of avulsion fracture of his cuboid.  He was placed in a splint and given crutches and has been nonweightbearing since then.  He does not report significant pain.  We did remove the splint.  He has global swelling around his foot and ankle on the right side but is well located and neurovascularly intact.  He has some limited flexion extension secondary to being in a splint for the last week.  I did review the x-rays that were performed in the emergency room of his right ankle.  There is a small avulsion his cuboid but overall the ankle is well located with no obvious significant fracture or malalignment.  We spoke about his injury and I feel comfortable with having him placed in a cam walking boot with attempting weightbearing as tolerated.  He can take the boot off for hygiene purposes and for icing and to work on range of motion of his ankle.  He does not need to sleep in the boot.  I would like to see him back in 2 weeks for the repeat 3 views of the right ankle.  All questions and concerns were addressed and answered.

## 2023-07-29 ENCOUNTER — Encounter: Payer: Self-pay | Admitting: Family Medicine

## 2023-07-29 ENCOUNTER — Ambulatory Visit: Payer: BC Managed Care – PPO | Admitting: Family Medicine

## 2023-07-29 VITALS — BP 128/82 | HR 70 | Temp 97.3°F | Wt 274.4 lb

## 2023-07-29 DIAGNOSIS — K219 Gastro-esophageal reflux disease without esophagitis: Secondary | ICD-10-CM

## 2023-07-29 DIAGNOSIS — Z6841 Body Mass Index (BMI) 40.0 and over, adult: Secondary | ICD-10-CM

## 2023-07-29 DIAGNOSIS — E119 Type 2 diabetes mellitus without complications: Secondary | ICD-10-CM | POA: Diagnosis not present

## 2023-07-29 DIAGNOSIS — E66813 Obesity, class 3: Secondary | ICD-10-CM | POA: Diagnosis not present

## 2023-07-29 DIAGNOSIS — Z7985 Long-term (current) use of injectable non-insulin antidiabetic drugs: Secondary | ICD-10-CM | POA: Diagnosis not present

## 2023-07-29 DIAGNOSIS — Z1211 Encounter for screening for malignant neoplasm of colon: Secondary | ICD-10-CM

## 2023-07-29 LAB — POCT GLYCOSYLATED HEMOGLOBIN (HGB A1C): Hemoglobin A1C: 6.9 % — AB (ref 4.0–5.6)

## 2023-07-29 MED ORDER — PANTOPRAZOLE SODIUM 20 MG PO TBEC
20.0000 mg | DELAYED_RELEASE_TABLET | Freq: Every day | ORAL | 3 refills | Status: DC | PRN
Start: 2023-07-29 — End: 2023-10-26

## 2023-07-29 MED ORDER — TIRZEPATIDE 10 MG/0.5ML ~~LOC~~ SOAJ: 10.0000 mg | SUBCUTANEOUS | 0 refills | Status: AC

## 2023-07-29 MED ORDER — TIRZEPATIDE 12.5 MG/0.5ML ~~LOC~~ SOAJ: 12.5000 mg | SUBCUTANEOUS | 0 refills | Status: AC

## 2023-07-29 NOTE — Progress Notes (Signed)
 Assessment/Plan:      Type 2 Diabetes Mellitus Well-controlled with an A1c of 6.9. Currently on tirzepatide 7.5 mg weekly.  Class III severe obesity with BMI of 51.  Weight increased by 8 pounds since the last visit, likely due to the holiday season. Current dose not adequately curbing appetite. Discussed increasing tirzepatide to 10 mg weekly for one month, then to 12.5 mg weekly for two months. Higher doses may improve appetite control and metabolic parameters. - Increase tirzepatide to 10 mg weekly for one month, then to 12.5 mg weekly for two months - Follow up in three months to recheck A1c and assess response to increased tirzepatide dosage  Hypertension Well-controlled with current medications. Blood pressure today is 120/82 mmHg. Currently on amlodipine 10 mg and lisinopril 10 mg. - Continue current antihypertensive medications (amlodipine 10 mg and lisinopril 10 mg)  Hyperlipidemia Previously noted with a cholesterol level of 110 mg/dL in October. Advised to manage weight and diet, though physical activity is limited due to a fractured ankle. Discussed potential lipid-lowering effects of tirzepatide and importance of lifestyle modifications. - Repeat fasting lipid panel at the next visit - Encourage low-fat diet and exercise as tolerated  Gastroesophageal Reflux Disease (GERD) Managed with pantoprazole 20 mg as needed. Reports good control of symptoms with this regimen. - Refill pantoprazole 20 mg as needed  Insomnia Intermittent, managed with trazodone 50 mg as needed. Reports using trazodone infrequently, last dose taken last Saturday. - Continue trazodone 50 mg as needed for insomnia  General Health Maintenance Due for routine screenings and vaccinations. At 48 years old, due for colon cancer screening. Declined pneumonia vaccination at this visit. Discussed options for colon cancer screening, including Cologuard and colonoscopy. Opted for Cologuard. - Order Cologuard for  colon cancer screening - Discuss pneumonia vaccination at a future visit         Medications Discontinued During This Encounter  Medication Reason   tirzepatide (MOUNJARO) 7.5 MG/0.5ML Pen    ondansetron (ZOFRAN) 4 MG tablet    pantoprazole (PROTONIX) 20 MG tablet Reorder    Return in about 3 months (around 10/26/2023) for fasting labs, DM, BP, HLD.    Subjective:   Encounter date: 07/29/2023  Henry Sanchez is a 48 y.o. male who has GERD (gastroesophageal reflux disease); Bell palsy; Dyslipidemia associated with type 2 diabetes mellitus (HCC); Hypertension associated with diabetes (HCC); Class 3 severe obesity with serious comorbidity in adult [facility]); Left foot pain; and Primary insomnia on their problem list..   He  has a past medical history of Diabetes mellitus without complication (HCC) and Hypertension.Marland Kitchen   He presents with chief complaint of Medical Management of Chronic Issues (for BP, DM, HLD. Will schedule eye exam) .   Discussed the use of AI scribe software for clinical note transcription with the patient, who gave verbal consent to proceed.  History of Present Illness   Henry Sanchez is a 48 year old male with diabetes, hypertension, and hyperlipidemia who presents for follow-up on diabetes and weight management.  He is on tirzepatide 7.5 mg weekly for diabetes management. His A1c is well controlled at 6.9, but he has gained about eight pounds since the last visit, possibly due to the holiday season. He wants to eat less, feeling the current dose of tirzepatide is insufficient.  Hypertension is managed with amlodipine 10 mg and lisinopril 10 mg. He reports his blood pressure is well controlled.  He mentions that his cholesterol was slightly elevated at 110 in October.  He has a history of insomnia and uses trazodone 50 mg as needed. It helps him, although he has not needed it this week, with the last dose taken the previous Saturday. He sometimes experiences  anxiety that affects his sleep, leading him to stay up watching TV.  He recently fractured his right ankle and is currently wearing a cam boot. The pain is manageable now, and the catheter has been removed.  He takes pantoprazole 20 mg as needed for acid reflux, which works well for him. He does not need to take it daily.  No chest pain, shortness of breath, nausea, or abdominal discomfort.         No past surgical history on file.  Outpatient Medications Prior to Visit  Medication Sig Dispense Refill   acetaminophen (TYLENOL) 325 MG tablet Take 2 tablets (650 mg total) by mouth every 6 (six) hours as needed. 36 tablet 0   amLODipine (NORVASC) 10 MG tablet Take 1 tablet by mouth once daily 90 tablet 0   ibuprofen (ADVIL) 600 MG tablet Take 1 tablet (600 mg total) by mouth every 6 (six) hours as needed. 30 tablet 0   lisinopril (ZESTRIL) 10 MG tablet Take 1 tablet by mouth once daily 90 tablet 0   naproxen (NAPROSYN) 500 MG tablet Take 1 tablet (500 mg total) by mouth 2 (two) times daily. 30 tablet 0   oxyCODONE (ROXICODONE) 5 MG immediate release tablet Take 1 tablet (5 mg total) by mouth every 6 (six) hours as needed for severe pain (pain score 7-10). 10 tablet 0   traZODone (DESYREL) 50 MG tablet Take 0.5-1 tablets (25-50 mg total) by mouth at bedtime as needed for sleep. 30 tablet 3   valACYclovir (VALTREX) 1000 MG tablet Take 1 tablet (1,000 mg total) by mouth 3 (three) times daily. 20 tablet 0   pantoprazole (PROTONIX) 20 MG tablet TAKE 1 TABLET BY MOUTH ONCE DAILY AS NEEDED FOR  HEARTBURN  OR  INDIGESTION. 90 tablet 0   tirzepatide (MOUNJARO) 7.5 MG/0.5ML Pen Inject 7.5 mg into the skin once a week. 6 mL 0   ondansetron (ZOFRAN) 4 MG tablet Take 1 tablet (4 mg total) by mouth every 8 (eight) hours as needed for nausea or vomiting. (Patient not taking: Reported on 07/29/2023) 20 tablet 0   No facility-administered medications prior to visit.    Family History  Problem Relation Age  of Onset   Breast cancer Mother     Social History   Socioeconomic History   Marital status: Single    Spouse name: Not on file   Number of children: Not on file   Years of education: Not on file   Highest education level: Not on file  Occupational History   Not on file  Tobacco Use   Smoking status: Never    Passive exposure: Never   Smokeless tobacco: Never  Vaping Use   Vaping status: Never Used  Substance and Sexual Activity   Alcohol use: Never   Drug use: No   Sexual activity: Not on file  Other Topics Concern   Not on file  Social History Narrative   Not on file   Social Drivers of Health   Financial Resource Strain: Not on File (02/28/2019)   Received from [employer], [employer]    Financial Resource Strain: 0  Food Insecurity: Not on File (02/28/2019)   Received from [place], [facility] Insecurity    Food: 0  Transportation Needs:  Not on File (02/28/2019)   Received from [facility], [employer] Needs    Transportation: 0  Physical Activity: Not on File (02/28/2019)   Received from [place], [place]   Physical Activity    Physical Activity: 0  Stress: Not on File (02/28/2019)   Received from [place], [place]   Stress    Stress: 0  Social Connections: Not on File (02/28/2019)   Received from [place], [place]   Social Connections    Social Connections and Isolation: 0  Intimate Partner Violence: Not on file                                                                                                  Objective:  Physical Exam: BP 128/82   Pulse 70   Temp (!) 97.3 F (36.3 C) (Temporal)   Wt 274 lb 6.4 oz (124.5 kg)   SpO2 98%   BMI 51.85 kg/m   Wt Readings from Last 3 Encounters:  07/29/23 274 lb 6.4 oz (124.5 kg)  07/19/23 270 lb (122.5 kg)  03/15/23 266 lb 3.2 oz (120.7 kg)     Physical Exam Constitutional:      Appearance: Normal appearance.  HENT:     Head: Normocephalic and atraumatic.     Right Ear: Hearing normal.      Left Ear: Hearing normal.     Nose: Nose normal.  Eyes:     General: No scleral icterus.       Right eye: No discharge.        Left eye: No discharge.     Extraocular Movements: Extraocular movements intact.  Cardiovascular:     Rate and Rhythm: Normal rate and regular rhythm.     Heart sounds: Normal heart sounds.  Pulmonary:     Effort: Pulmonary effort is normal.     Breath sounds: Normal breath sounds.  Abdominal:     Palpations: Abdomen is soft.     Tenderness: There is no abdominal tenderness.  Musculoskeletal:     Left ankle: Decreased range of motion (In a CAM boot).  Skin:    General: Skin is warm.     Findings: No rash.  Neurological:     General: No focal deficit present.     Mental Status: He is alert.     Cranial Nerves: No cranial nerve deficit.  Psychiatric:        Mood and Affect: Mood normal.        Behavior: Behavior normal.        Thought Content: Thought content normal.        Judgment: Judgment normal.     DG Ankle Complete Right Result Date: 07/19/2023 CLINICAL DATA:  Larey Seat with pain and swelling EXAM: RIGHT ANKLE - COMPLETE 3+ VIEW COMPARISON:  None Available. FINDINGS: Probable avulsion fracture of the posterior corner of the cuboid bone. Question minor avulsion fracture also the calcaneus from calcaneofibular ligament injury. No other regional fracture IMPRESSION: Probable avulsion fracture of the posterior corner of the cuboid bone. Question minor avulsion fracture of the calcaneus from calcaneofibular ligament injury. Electronically Signed  By: Paulina Fusi M.D.   On: 07/19/2023 22:07    Recent Results (from the past 2160 hours)  POCT glycosylated hemoglobin (Hb A1C)     Status: Abnormal   Collection Time: 07/29/23  3:16 PM  Result Value Ref Range   Hemoglobin A1C 6.9 (A) 4.0 - 5.6 %   HbA1c POC (<> result, manual entry)     HbA1c, POC (prediabetic range)     HbA1c, POC (controlled diabetic range)          Garner [employer], MD,  MS

## 2023-08-17 ENCOUNTER — Ambulatory Visit: Payer: BC Managed Care – PPO | Admitting: Orthopaedic Surgery

## 2023-10-07 ENCOUNTER — Other Ambulatory Visit: Payer: Self-pay | Admitting: Family Medicine

## 2023-10-07 DIAGNOSIS — I152 Hypertension secondary to endocrine disorders: Secondary | ICD-10-CM

## 2023-10-07 DIAGNOSIS — E119 Type 2 diabetes mellitus without complications: Secondary | ICD-10-CM

## 2023-10-07 DIAGNOSIS — F5101 Primary insomnia: Secondary | ICD-10-CM

## 2023-10-19 ENCOUNTER — Encounter (HOSPITAL_COMMUNITY): Payer: Self-pay

## 2023-10-26 ENCOUNTER — Ambulatory Visit: Payer: BC Managed Care – PPO | Admitting: Family Medicine

## 2023-10-26 ENCOUNTER — Encounter: Payer: Self-pay | Admitting: Family Medicine

## 2023-10-26 VITALS — BP 130/84 | HR 100 | Temp 97.6°F | Resp 18 | Wt 270.8 lb

## 2023-10-26 DIAGNOSIS — E1169 Type 2 diabetes mellitus with other specified complication: Secondary | ICD-10-CM

## 2023-10-26 DIAGNOSIS — I152 Hypertension secondary to endocrine disorders: Secondary | ICD-10-CM | POA: Diagnosis not present

## 2023-10-26 DIAGNOSIS — E66813 Obesity, class 3: Secondary | ICD-10-CM

## 2023-10-26 DIAGNOSIS — K219 Gastro-esophageal reflux disease without esophagitis: Secondary | ICD-10-CM

## 2023-10-26 DIAGNOSIS — Z7984 Long term (current) use of oral hypoglycemic drugs: Secondary | ICD-10-CM

## 2023-10-26 DIAGNOSIS — E785 Hyperlipidemia, unspecified: Secondary | ICD-10-CM

## 2023-10-26 DIAGNOSIS — E1159 Type 2 diabetes mellitus with other circulatory complications: Secondary | ICD-10-CM

## 2023-10-26 DIAGNOSIS — T50905A Adverse effect of unspecified drugs, medicaments and biological substances, initial encounter: Secondary | ICD-10-CM

## 2023-10-26 DIAGNOSIS — R112 Nausea with vomiting, unspecified: Secondary | ICD-10-CM

## 2023-10-26 LAB — POCT GLYCOSYLATED HEMOGLOBIN (HGB A1C): Hemoglobin A1C: 7.1 % — AB (ref 4.0–5.6)

## 2023-10-26 MED ORDER — PANTOPRAZOLE SODIUM 40 MG PO TBEC: 40.0000 mg | DELAYED_RELEASE_TABLET | Freq: Every day | ORAL | 3 refills | Status: AC | PRN

## 2023-10-26 MED ORDER — METOCLOPRAMIDE HCL 10 MG PO TABS: 10.0000 mg | ORAL_TABLET | Freq: Four times a day (QID) | ORAL | 0 refills | Status: DC | PRN

## 2023-10-26 MED ORDER — TIRZEPATIDE 10 MG/0.5ML ~~LOC~~ SOAJ: 10.0000 mg | SUBCUTANEOUS | 0 refills | Status: AC

## 2023-10-26 NOTE — Progress Notes (Signed)
 Assessment & Plan   Assessment/Plan:    Assessment & Plan Type 2 diabetes mellitus with hyperglycemia Type 2 diabetes mellitus with hyperglycemia, previously well-controlled on tirzepatide  7.5 mg. A1c slightly uncontrolled at 7.1%. Intolerance to tirzepatide  10 mg due to nausea and vomiting. Discussed potential benefits of managing nausea with metoclopramide and dietary modifications to improve tolerance. - Administer metoclopramide 10 mg every 6 hours as needed for nausea and vomiting. - Encourage smaller meals and use of liquid replacement shakes, especially on the first day of tirzepatide  administration. - Check hemoglobin A1c. - Encourage low carb diet and continued physical activity. - Follow up in three months to reassess diabetes management and check fasting labs for renal function, cholesterol, and other cardiac and diabetic risk stratification.  Severe obesity class III Obesity with a BMI of 51. Previous weight loss of 8 pounds maintained. Continued management with tirzepatide  and lifestyle modifications. - Continue tirzepatide  10 mg. - Encourage low carb diet and continued physical activity.  Hypertension, controlled Hypertension is well-controlled with current medication regimen. Blood pressure today is 130/84 mmHg.  Hyperlipidemia, unspecified Hyperlipidemia noted with previous cholesterol levels slightly elevated. Plan to reassess with lab work at next follow-up.  Gastroesophageal reflux disease without esophagitis Gastroesophageal reflux disease managed well with pantoprazole . Reports not receiving medication refills recently. - Send prescription for pantoprazole  40 mg.      Medications Discontinued During This Encounter  Medication Reason   pantoprazole  (PROTONIX ) 20 MG tablet     Return in about 3 months (around 01/26/2024) for DM, fasting labs.        Subjective:   Encounter date: 10/26/2023  Henry Sanchez is a 48 y.o. male who has GERD  (gastroesophageal reflux disease); Bell palsy; Dyslipidemia associated with type 2 diabetes mellitus (HCC); Hypertension associated with diabetes (HCC); Class 3 severe obesity with serious comorbidity in adult; Left foot pain; and Primary insomnia on their problem list..   He  has a past medical history of Diabetes mellitus without complication (HCC) and Hypertension.Henry Sanchez   He presents with chief complaint of Medical Management of Chronic Issues (3 month follow up. Pt is not fasting today. Pt voiced concerns with side effects from Mounjario with nausea and vomiting //HM- eye exam due (cologuard sent on Monday). ) .   Discussed the use of AI scribe software for clinical note transcription with the patient, who gave verbal consent to proceed.  History of Present Illness Henry Sanchez is a 48 year old male with type 2 diabetes who presents for follow-up on diabetes management and medication tolerance.  He is following up on his type 2 diabetes management, specifically regarding his use of Mounjaro  (tirzepatide ) and weight management. Initially, he was on a 7.5 mg dose of Mounjaro , which he tolerated with some success, including a weight loss of about eight pounds. However, upon increasing the dose to 10 mg, he experienced significant nausea and vomiting, which persisted until the next dose was due. He has not been treated for the nausea previously.  He is currently on lisinopril  10 mg and amlodipine  10 mg for blood pressure management. He reports issues with receiving his pantoprazole  prescription for reflux.  In terms of social history, he is still cooking and is nearing the completion of his schooling, expected to finish in November. No abdominal pain or leg swelling.     ROS  History reviewed. No pertinent surgical history.  Outpatient Medications Prior to Visit  Medication Sig Dispense Refill   acetaminophen  (TYLENOL ) 325 MG tablet Take 2  tablets (650 mg total) by mouth every 6 (six) hours  as needed. 36 tablet 0   amLODipine  (NORVASC ) 10 MG tablet Take 1 tablet by mouth once daily 90 tablet 0   ibuprofen  (ADVIL ) 600 MG tablet Take 1 tablet (600 mg total) by mouth every 6 (six) hours as needed. 30 tablet 0   lisinopril  (ZESTRIL ) 10 MG tablet Take 1 tablet by mouth once daily 90 tablet 0   naproxen  (NAPROSYN ) 500 MG tablet Take 1 tablet (500 mg total) by mouth 2 (two) times daily. 30 tablet 0   oxyCODONE  (ROXICODONE ) 5 MG immediate release tablet Take 1 tablet (5 mg total) by mouth every 6 (six) hours as needed for severe pain (pain score 7-10). 10 tablet 0   traZODone  (DESYREL ) 50 MG tablet TAKE 1/2 TO 1 (ONE-HALF TO ONE) TABLET BY MOUTH AT BEDTIME AS NEEDED FOR SLEEP 30 tablet 0   valACYclovir  (VALTREX ) 1000 MG tablet Take 1 tablet (1,000 mg total) by mouth 3 (three) times daily. 20 tablet 0   pantoprazole  (PROTONIX ) 20 MG tablet Take 1 tablet (20 mg total) by mouth daily as needed for heartburn or indigestion. 90 tablet 3   No facility-administered medications prior to visit.    Family History  Problem Relation Age of Onset   Breast cancer Mother     Social History   Socioeconomic History   Marital status: Single    Spouse name: Not on file   Number of children: Not on file   Years of education: Not on file   Highest education level: Not on file  Occupational History   Not on file  Tobacco Use   Smoking status: Never    Passive exposure: Never   Smokeless tobacco: Never  Vaping Use   Vaping status: Never Used  Substance and Sexual Activity   Alcohol use: Never   Drug use: No   Sexual activity: Not on file  Other Topics Concern   Not on file  Social History Narrative   Not on file   Social Drivers of Health   Financial Resource Strain: Not on File (02/28/2019)   Received from [employer], [employer]    Financial Resource Strain: 0  Food Insecurity: Not on File (02/28/2019)   Received from [place], [place]   Food Insecurity    Food: 0   Transportation Needs: Not on File (02/28/2019)   Received from [place], [employer] Needs    Transportation: 0  Physical Activity: Not on File (02/28/2019)   Received from [place], [place]   Physical Activity    Physical Activity: 0  Stress: Not on File (02/28/2019)   Received from [facility], [place]   Stress    Stress: 0  Social Connections: Not on File (02/28/2019)   Received from [place], [place]   Social Connections    Social Connections and Isolation: 0  Intimate Partner Violence: Not on file                                                                                                  Objective:  Physical Exam: BP 130/84 (BP Location: Left Arm, Patient Position: Sitting, Cuff Size: Large)   Pulse 100   Temp 97.6 F (36.4 C) (Temporal)   Resp 18   Wt 270 lb 12.8 oz (122.8 kg)   SpO2 96%   BMI 51.17 kg/m   Wt Readings from Last 3 Encounters:  10/26/23 270 lb 12.8 oz (122.8 kg)  07/29/23 274 lb 6.4 oz (124.5 kg)  07/19/23 270 lb (122.5 kg)    Physical Exam VITALS: BP- 130/84 MEASUREMENTS: Weight- 270, BMI- 51.0. GENERAL: Alert, cooperative, well developed, no acute distress. HEENT: Normocephalic, normal oropharynx, moist mucous membranes. CHEST: Clear to auscultation bilaterally, no wheezes, rhonchi, or crackles. CARDIOVASCULAR: Normal heart rate and rhythm, S1 and S2 normal without murmurs. ABDOMEN: Soft, non-tender, non-distended, without organomegaly, normal bowel sounds. EXTREMITIES: No cyanosis or edema. NEUROLOGICAL: Cranial nerves grossly intact, moves all extremities without gross motor or sensory deficit.   Physical Exam  No results found.  Recent Results (from the past 2160 hours)  POCT glycosylated hemoglobin (Hb A1C)     Status: Abnormal   Collection Time: 07/29/23  3:16 PM  Result Value Ref Range   Hemoglobin A1C 6.9 (A) 4.0 - 5.6 %   HbA1c POC (<> result, manual entry)     HbA1c, POC (prediabetic range)     HbA1c, POC (controlled diabetic  range)    POCT glycosylated hemoglobin (Hb A1C)     Status: Abnormal   Collection Time: 10/26/23  3:50 PM  Result Value Ref Range   Hemoglobin A1C 7.1 (A) 4.0 - 5.6 %   HbA1c POC (<> result, manual entry)     HbA1c, POC (prediabetic range)     HbA1c, POC (controlled diabetic range)          Carnell Christian, MD, MS

## 2023-10-26 NOTE — Patient Instructions (Signed)
  VISIT SUMMARY: Today, you came in for a follow-up on your type 2 diabetes management and medication tolerance. We discussed your current medications, including Mounjaro  (tirzepatide ), and addressed the nausea and vomiting you experienced with the increased dose. We also reviewed your blood pressure, weight management, and issues with your reflux medication.  YOUR PLAN: -TYPE 2 DIABETES MELLITUS WITH HYPERGLYCEMIA: Type 2 diabetes is a condition where your body does not use insulin properly, leading to high blood sugar levels. Your A1c is slightly uncontrolled at 7.1%. We will manage your nausea with metoclopramide 10 mg every 6 hours as needed and encourage smaller meals and liquid replacement shakes, especially on the first day of tirzepatide  administration. Continue with a low carb diet and physical activity. We will check your hemoglobin A1c and follow up in three months to reassess your diabetes management and check fasting labs for renal function, cholesterol, and other cardiac and diabetic risk factors.  -OBESITY, UNSPECIFIED: Obesity is a condition characterized by excessive body fat. Your BMI is 51, and you have maintained a previous weight loss of 8 pounds. Continue with tirzepatide  10 mg, a low carb diet, and physical activity to manage your weight.  -HYPERTENSION, CONTROLLED: Hypertension is high blood pressure. Your blood pressure is well-controlled with your current medications, lisinopril  and amlodipine , and today's reading was 130/84 mmHg.  -HYPERLIPIDEMIA, UNSPECIFIED: Hyperlipidemia is having high levels of fats in your blood. We noted that your previous cholesterol levels were slightly elevated, and we will reassess with lab work at your next follow-up.  -GASTROESOPHAGEAL REFLUX DISEASE WITHOUT ESOPHAGITIS: Gastroesophageal reflux disease (GERD) is a condition where stomach acid frequently flows back into the tube connecting your mouth and stomach. We will send a prescription for  pantoprazole  40 mg to manage your reflux symptoms.  INSTRUCTIONS: Follow up in three months to reassess your diabetes management and check fasting labs for renal function, cholesterol, and other cardiac and diabetic risk factors.

## 2024-01-26 ENCOUNTER — Ambulatory Visit: Admitting: Family Medicine

## 2024-01-26 ENCOUNTER — Encounter: Payer: Self-pay | Admitting: Family Medicine

## 2024-01-26 VITALS — BP 120/70 | HR 97 | Temp 97.6°F | Resp 18 | Wt 270.2 lb

## 2024-01-26 DIAGNOSIS — R112 Nausea with vomiting, unspecified: Secondary | ICD-10-CM

## 2024-01-26 DIAGNOSIS — I152 Hypertension secondary to endocrine disorders: Secondary | ICD-10-CM

## 2024-01-26 DIAGNOSIS — T50905A Adverse effect of unspecified drugs, medicaments and biological substances, initial encounter: Secondary | ICD-10-CM

## 2024-01-26 DIAGNOSIS — R351 Nocturia: Secondary | ICD-10-CM

## 2024-01-26 DIAGNOSIS — F5101 Primary insomnia: Secondary | ICD-10-CM

## 2024-01-26 DIAGNOSIS — E1159 Type 2 diabetes mellitus with other circulatory complications: Secondary | ICD-10-CM | POA: Diagnosis not present

## 2024-01-26 DIAGNOSIS — E66813 Obesity, class 3: Secondary | ICD-10-CM | POA: Diagnosis not present

## 2024-01-26 DIAGNOSIS — E785 Hyperlipidemia, unspecified: Secondary | ICD-10-CM

## 2024-01-26 DIAGNOSIS — Z23 Encounter for immunization: Secondary | ICD-10-CM

## 2024-01-26 DIAGNOSIS — Z7985 Long-term (current) use of injectable non-insulin antidiabetic drugs: Secondary | ICD-10-CM

## 2024-01-26 DIAGNOSIS — E1169 Type 2 diabetes mellitus with other specified complication: Secondary | ICD-10-CM | POA: Diagnosis not present

## 2024-01-26 DIAGNOSIS — E1165 Type 2 diabetes mellitus with hyperglycemia: Secondary | ICD-10-CM | POA: Diagnosis not present

## 2024-01-26 DIAGNOSIS — Z9189 Other specified personal risk factors, not elsewhere classified: Secondary | ICD-10-CM

## 2024-01-26 LAB — CBC WITH DIFFERENTIAL/PLATELET
Basophils Absolute: 0 K/uL (ref 0.0–0.1)
Basophils Relative: 0.4 % (ref 0.0–3.0)
Eosinophils Absolute: 0.1 K/uL (ref 0.0–0.7)
Eosinophils Relative: 1.1 % (ref 0.0–5.0)
HCT: 51.1 % (ref 39.0–52.0)
Hemoglobin: 16.2 g/dL (ref 13.0–17.0)
Lymphocytes Relative: 50.4 % — ABNORMAL HIGH (ref 12.0–46.0)
Lymphs Abs: 2.6 K/uL (ref 0.7–4.0)
MCHC: 31.6 g/dL (ref 30.0–36.0)
MCV: 85.7 fl (ref 78.0–100.0)
Monocytes Absolute: 0.6 K/uL (ref 0.1–1.0)
Monocytes Relative: 12.5 % — ABNORMAL HIGH (ref 3.0–12.0)
Neutro Abs: 1.8 K/uL (ref 1.4–7.7)
Neutrophils Relative %: 35.6 % — ABNORMAL LOW (ref 43.0–77.0)
Platelets: 212 K/uL (ref 150.0–400.0)
RBC: 5.96 Mil/uL — ABNORMAL HIGH (ref 4.22–5.81)
RDW: 14.9 % (ref 11.5–15.5)
WBC: 5.2 K/uL (ref 4.0–10.5)

## 2024-01-26 LAB — COMPREHENSIVE METABOLIC PANEL WITH GFR
ALT: 35 U/L (ref 0–53)
AST: 23 U/L (ref 0–37)
Albumin: 4.4 g/dL (ref 3.5–5.2)
Alkaline Phosphatase: 71 U/L (ref 39–117)
BUN: 12 mg/dL (ref 6–23)
CO2: 30 meq/L (ref 19–32)
Calcium: 9.3 mg/dL (ref 8.4–10.5)
Chloride: 102 meq/L (ref 96–112)
Creatinine, Ser: 1 mg/dL (ref 0.40–1.50)
GFR: 90.69 mL/min (ref >60.00)
Glucose, Bld: 112 mg/dL — ABNORMAL HIGH (ref 70–99)
Potassium: 3.7 meq/L (ref 3.5–5.1)
Sodium: 139 meq/L (ref 135–145)
Total Bilirubin: 0.4 mg/dL (ref 0.2–1.2)
Total Protein: 7.7 g/dL (ref 6.0–8.3)

## 2024-01-26 LAB — MICROALBUMIN / CREATININE URINE RATIO
Creatinine,U: 98.8 mg/dL
Microalb Creat Ratio: 22.2 mg/g (ref 0.0–30.0)
Microalb, Ur: 2.2 mg/dL — ABNORMAL HIGH (ref 0.0–1.9)

## 2024-01-26 LAB — LIPID PANEL
Cholesterol: 183 mg/dL (ref 0–200)
HDL: 41.9 mg/dL (ref >39.00)
LDL Cholesterol: 120 mg/dL — ABNORMAL HIGH (ref 0–99)
NonHDL: 140.81
Total CHOL/HDL Ratio: 4
Triglycerides: 103 mg/dL (ref 0.0–149.0)
VLDL: 20.6 mg/dL (ref 0.0–40.0)

## 2024-01-26 LAB — TSH: TSH: 0.76 u[IU]/mL (ref 0.35–5.50)

## 2024-01-26 LAB — POCT GLYCOSYLATED HEMOGLOBIN (HGB A1C): Hemoglobin A1C: 7 % — AB (ref 4.0–5.6)

## 2024-01-26 LAB — PSA: PSA: 2.21 ng/mL (ref 0.10–4.00)

## 2024-01-26 MED ORDER — LISINOPRIL 10 MG PO TABS: 10.0000 mg | ORAL_TABLET | Freq: Every day | ORAL | 3 refills | Status: DC

## 2024-01-26 MED ORDER — AMLODIPINE BESYLATE 10 MG PO TABS: 10.0000 mg | ORAL_TABLET | Freq: Every day | ORAL | 3 refills | Status: AC

## 2024-01-26 MED ORDER — ROSUVASTATIN CALCIUM 10 MG PO TABS: 10.0000 mg | ORAL_TABLET | Freq: Every day | ORAL | 3 refills | Status: DC

## 2024-01-26 MED ORDER — TIRZEPATIDE 12.5 MG/0.5ML ~~LOC~~ SOAJ: 12.5000 mg | SUBCUTANEOUS | 3 refills | Status: DC

## 2024-01-26 MED ORDER — METOCLOPRAMIDE HCL 10 MG PO TABS: 10.0000 mg | ORAL_TABLET | Freq: Four times a day (QID) | ORAL | 3 refills | Status: AC | PRN

## 2024-01-26 MED ORDER — TRAZODONE HCL 50 MG PO TABS
ORAL_TABLET | ORAL | 3 refills | Status: AC
Start: 2024-01-26 — End: ?

## 2024-01-26 NOTE — Patient Instructions (Addendum)
  VISIT SUMMARY: Today, we reviewed your management plan for type 2 diabetes, hypertension, hyperlipidemia, obesity, and other health concerns. Your A1c has improved to 7.0, but we aim to lower it further. We discussed adjustments to your medications and lifestyle to better manage your conditions.  YOUR PLAN: -TYPE 2 DIABETES MELLITUS: Type 2 diabetes is a condition where your body does not use insulin properly, leading to high blood sugar levels. Your A1c has improved to 7.0, but we aim for it to be below 7.0. We will increase your tirzepatide  dose to 12.5 mg weekly to help lower your A1c and support weight loss. Continue using metoclopramide  for nausea as needed. We will also check your kidney function and recommend a low carb diet and regular exercise.  -HYPERTENSION: Hypertension, or high blood pressure, increases your risk for heart and kidney problems. Your recent readings were slightly elevated, but your home readings are better. We will recheck your blood pressure and may adjust your medications if needed. We will also check your blood work to rule out other causes of high blood pressure.  -HYPERLIPIDEMIA: Hyperlipidemia means you have high levels of fats in your blood, which can increase your risk of heart disease. We will check your fasting lipid levels and may start you on a statin medication to lower your cardiovascular risk.  -SEVERE CLASS 3 OBESITY (BMI 51): Severe class 3 obesity means you have a very high body mass index, which can affect your overall health. We encourage you to follow a low carb diet and exercise regularly to help with weight loss, which can improve your diabetes and reduce your cardiovascular risk.  -NOCTURIA AND INCREASED URINARY FREQUENCY: Nocturia means waking up at night to urinate, and increased urinary frequency means you need to urinate more often than usual. These symptoms may be related to your diabetes or other conditions. We will check your urine and add a  PSA screening to investigate further.  -INSOMNIA: Insomnia is difficulty falling or staying asleep. You find trazodone  helpful for managing your insomnia, so we will refill your prescription.  -RISK FOR OBSTRUCTIVE SLEEP APNEA: Obstructive sleep apnea is a condition where your breathing stops and starts during sleep, which can cause daytime fatigue and nocturia. If your symptoms persist, we may need to discuss a sleep study to confirm this diagnosis.  INSTRUCTIONS: 1. Increase tirzepatide  to 12.5 mg weekly and continue using metoclopramide  as needed for nausea. 2. Follow a low carb diet and exercise regularly. 3. Recheck blood pressure and consider medication adjustments if home readings are consistently high. 4. Check fasting lipid levels and consider starting subastatin 10 mg based on results. 5. Check urine analysis and add PSA screening. 6. Refill trazodone  for insomnia. 7. Discuss potential need for a sleep study if symptoms of sleep apnea persist.                      Contains text generated by [employer].                                 Contains text generated by [employer]bridge.

## 2024-01-26 NOTE — Progress Notes (Signed)
 Assessment & Plan   Assessment/Plan:   Assessment & Plan Type 2 diabetes mellitus Type 2 diabetes mellitus with A1c at 7.0, improved but above target of <7.0. Managed with tirzepatide  10 mg weekly, experiencing mild nausea managed with metoclopramide . Discussed increasing tirzepatide  to 12.5 mg for further A1c reduction and weight loss, with potential for increased GI side effects. Benefits include cardiovascular and renal protection. He prefers increasing tirzepatide  while managing GI side effects with metoclopramide  and dietary adjustments. - Increase tirzepatide  to 12.5 mg weekly - Refill metoclopramide  10 mg as needed for nausea - Check renal function with microalbumin creatinine ratio - Check creatinine to assess for chronic kidney disease - Recommend low carb diet and exercise  Hypertension Hypertension with current readings of 138/98 and 144/103, slightly elevated. Home readings reportedly around 120/70. Goal is <130/80 due to increased cardiovascular and renal risk in diabetics. No consistent high readings at home. - Recheck blood pressure - Consider titrating blood pressure medications if home readings are consistently elevated - Check CBC and TSH to rule out other causes of hypertension  Hyperlipidemia Hyperlipidemia with need to assess current lipid levels. Discussed starting subastatin 10 mg to lower cardiovascular risk, especially with diabetes, hypertension, and obesity. Statins reduce cardiovascular risk independent of cholesterol levels. - Check fasting lipid levels - Consider starting subastatin 10 mg based on lipid results  Severe class 3 obesity (BMI 51) Severe class 3 obesity with stable weight. Discussed benefits of weight loss, including improved diabetes control and reduced cardiovascular risk. Increasing tirzepatide  may aid in weight loss. - Encourage low carb diet and exercise  Nocturia and increased urinary frequency Increased urinary frequency and nocturia,  possibly related to diabetes or other conditions. Discussed potential link with sleep apnea and prostate changes. - Check urine analysis - Add PSA screening due to urinary symptoms  Insomnia Intermittent insomnia managed with trazodone , which he finds helpful. - Refill trazodone  for insomnia  Risk for obstructive sleep apnea High risk for obstructive sleep apnea, which can present as nocturia. Previous referral for sleep study noted, but no confirmation of completion. Reports daytime fatigue and increased nocturia. - Discuss potential need for sleep study if symptoms persist      Medications Discontinued During This Encounter  Medication Reason   acetaminophen  (TYLENOL ) 325 MG tablet    ibuprofen  (ADVIL ) 600 MG tablet    naproxen  (NAPROSYN ) 500 MG tablet    oxyCODONE  (ROXICODONE ) 5 MG immediate release tablet    traZODone  (DESYREL ) 50 MG tablet Reorder   amLODipine  (NORVASC ) 10 MG tablet Reorder   lisinopril  (ZESTRIL ) 10 MG tablet Reorder   metoCLOPramide  (REGLAN ) 10 MG tablet Reorder    Return in about 3 months (around 04/27/2024) for DM, BP, fasting labs, HLD.        Subjective:   Encounter date: 01/26/2024  Henry Sanchez is a 48 y.o. male who has GERD (gastroesophageal reflux disease); Bell palsy; Dyslipidemia associated with type 2 diabetes mellitus (HCC); Hypertension associated with diabetes (HCC); Class 3 severe obesity with serious comorbidity in adult; Left foot pain; and Primary insomnia on their problem list..   He  has a past medical history of Diabetes mellitus without complication (HCC) and Hypertension.SABRA   He presents with chief complaint of Diabetes (3 month follow up. Pt is fasting today//HM due- vaccination, diabetic eye and foot exam and colonoscopy ) .   Discussed the use of AI scribe software for clinical note transcription with the patient, who gave verbal consent to proceed.  History of Present  Illness Henry Sanchez is a 48 year old male with  type 2 diabetes who presents for diabetic and chronic care management follow-up.  His A1c is currently 7.0, showing improvement from his last visit. He is on tirzepatide  10 mg IM weekly, usually administered on Fridays due to work schedule, as it causes nausea. Metoclopramide  is used to alleviate the nausea. He has not taken his dose this week yet. Patient reports compliance with his medications.  He has a history of hypertension with recent readings of 138/98 and 144/103, while at home, his blood pressure is typically around 120/70. He is currently taking amlodipine  10 mg and lisinopril  10 mg. No chest pain or shortness of breath, but he experiences frequent urination, even without fluid intake, and nocturia.  He has hyperlipidemia but is not currently on a statin. His lipid levels are due to be checked to determine the need for medication.  He has severe class 3 obesity with a BMI of 51, with stable weight since the last visit. He occasionally uses trazodone  for insomnia, taking half a tablet as needed, and requests a refill.  His mother recently had heart surgery, and he checks his blood pressure when she checks hers. No family history of prostate cancer reported. He experiences frequent urination and daytime fatigue. He has not completed a Cologuard test for colon cancer screening.     ROS  History reviewed. No pertinent surgical history.  Outpatient Medications Prior to Visit  Medication Sig Dispense Refill   pantoprazole  (PROTONIX ) 40 MG tablet Take 1 tablet (40 mg total) by mouth daily as needed for heartburn or indigestion. 90 tablet 3   valACYclovir  (VALTREX ) 1000 MG tablet Take 1 tablet (1,000 mg total) by mouth 3 (three) times daily. 20 tablet 0   acetaminophen  (TYLENOL ) 325 MG tablet Take 2 tablets (650 mg total) by mouth every 6 (six) hours as needed. 36 tablet 0   amLODipine  (NORVASC ) 10 MG tablet Take 1 tablet by mouth once daily 90 tablet 0   ibuprofen  (ADVIL ) 600 MG tablet  Take 1 tablet (600 mg total) by mouth every 6 (six) hours as needed. 30 tablet 0   lisinopril  (ZESTRIL ) 10 MG tablet Take 1 tablet by mouth once daily 90 tablet 0   metoCLOPramide  (REGLAN ) 10 MG tablet Take 1 tablet (10 mg total) by mouth every 6 (six) hours as needed for nausea, vomiting or refractory nausea / vomiting. 60 tablet 0   naproxen  (NAPROSYN ) 500 MG tablet Take 1 tablet (500 mg total) by mouth 2 (two) times daily. 30 tablet 0   oxyCODONE  (ROXICODONE ) 5 MG immediate release tablet Take 1 tablet (5 mg total) by mouth every 6 (six) hours as needed for severe pain (pain score 7-10). 10 tablet 0   traZODone  (DESYREL ) 50 MG tablet TAKE 1/2 TO 1 (ONE-HALF TO ONE) TABLET BY MOUTH AT BEDTIME AS NEEDED FOR SLEEP 30 tablet 0   No facility-administered medications prior to visit.    Family History  Problem Relation Age of Onset   Breast cancer Mother     Social History   Socioeconomic History   Marital status: Single    Spouse name: Not on file   Number of children: Not on file   Years of education: Not on file   Highest education level: Not on file  Occupational History   Not on file  Tobacco Use   Smoking status: Never    Passive exposure: Never   Smokeless tobacco: Never  Vaping Use  Vaping status: Never Used  Substance and Sexual Activity   Alcohol use: Never   Drug use: No   Sexual activity: Not Currently  Other Topics Concern   Not on file  Social History Narrative   Not on file   Social Drivers of Health   Financial Resource Strain: Not on File (02/28/2019)   Received from [employer]    Financial Resource Strain: 0  Food Insecurity: Not on File (02/28/2019)   Received from [facility] Insecurity    Food: 0  Transportation Needs: Not on File (02/28/2019)   Received from [employer] Needs    Transportation: 0  Physical Activity: Not on File (02/28/2019)   Received from [facility]   Physical Activity    Physical Activity: 0   Stress: Not on File (02/28/2019)   Received from [facility]   Stress    Stress: 0  Social Connections: Not on File (02/28/2019)   Received [facility]   Social Connections    Social Connections and Isolation: 0  Intimate Partner Violence: Not on file                                                                                                  Objective:  Physical Exam: BP 120/70 (Patient Position: Sitting) Comment: home  Pulse 97   Temp 97.6 F (36.4 C) (Temporal)   Resp 18   Wt 270 lb 3.2 oz (122.6 kg)   SpO2 100%   BMI 51.05 kg/m   Wt Readings from Last 3 Encounters:  01/26/24 270 lb 3.2 oz (122.6 kg)  10/26/23 270 lb 12.8 oz (122.8 kg)  07/29/23 274 lb 6.4 oz (124.5 kg)   STOP-BANG Score for Obstructive Sleep Apnea from [email or web address]  on 01/26/2024 ** All calculations should be rechecked by clinician prior to use **  RESULT SUMMARY: 5 points STOP-BANG  High risk of OSA    INPUTS: Do you snore loudly? --> 0 = No Do you often feel tired, fatigued, or sleepy during the daytime? --> 1 = Yes Has anyone observed you stop breathing during sleep? --> 0 = No Do you have (or are you being treated for) high blood pressure? --> 1 = Yes BMI --> 1 = >35 kg/m Age --> 0 = <=50 years Neck circumference --> 1 = >40 cm Gender --> 1 = Male  Physical Exam  GENERAL: Alert, cooperative, well developed, no acute distress HEENT: Normocephalic, normal oropharynx, moist mucous membranes CHEST: Clear to auscultation bilaterally, no wheezes, rhonchi, or crackles CARDIOVASCULAR: Normal heart rate and rhythm, S1 and S2 normal without murmurs ABDOMEN: Soft, non-tender, non-distended, without organomegaly, normal bowel sounds EXTREMITIES: No cyanosis or edema NEUROLOGICAL: Cranial nerves grossly intact, moves all extremities without gross motor or sensory deficit   Physical Exam  No results found.  Recent Results (from the past 2160 hours)  POCT glycosylated hemoglobin (Hb A1C)      Status: Abnormal   Collection Time: 01/26/24  8:18 AM  Result Value Ref Range   Hemoglobin A1C 7.0 (A) 4.0 - 5.6 %  HbA1c POC (<> result, manual entry)     HbA1c, POC (prediabetic range)     HbA1c, POC (controlled diabetic range)          Beverley Adine Hummer, MD, MS

## 2024-01-27 LAB — URINALYSIS W MICROSCOPIC + REFLEX CULTURE
Bacteria, UA: NONE SEEN /HPF
Bilirubin Urine: NEGATIVE
Glucose, UA: NEGATIVE
Ketones, ur: NEGATIVE
Leukocyte Esterase: NEGATIVE
Nitrites, Initial: NEGATIVE
Protein, ur: NEGATIVE
RBC / HPF: NONE SEEN /HPF (ref 0–2)
Specific Gravity, Urine: 1.014 (ref 1.001–1.035)
Squamous Epithelial / HPF: NONE SEEN /HPF (ref <=5)
WBC, UA: NONE SEEN /HPF (ref 0–5)
pH: 7.5 (ref 5.0–8.0)

## 2024-01-27 LAB — NO CULTURE INDICATED

## 2024-01-31 ENCOUNTER — Ambulatory Visit: Payer: Self-pay | Admitting: Family Medicine

## 2024-01-31 DIAGNOSIS — E1169 Type 2 diabetes mellitus with other specified complication: Secondary | ICD-10-CM

## 2024-01-31 MED ORDER — ROSUVASTATIN CALCIUM 20 MG PO TABS: 20.0000 mg | ORAL_TABLET | Freq: Every day | ORAL | 3 refills | Status: AC

## 2024-04-16 ENCOUNTER — Encounter: Payer: Self-pay | Admitting: Radiology

## 2024-04-20 NOTE — Progress Notes (Signed)
 Toron Bowring                                          MRN: [id number]   04/20/2024   The [facility] Quality Team Specialist reviewed this patient medical record for the purposes of chart review for care gap closure. The following were reviewed: abstraction for care gap closure-kidney health evaluation for diabetes:eGFR  and uACR.    [facility] Quality Team

## 2024-04-27 ENCOUNTER — Ambulatory Visit: Admitting: Family Medicine

## 2024-05-25 ENCOUNTER — Ambulatory Visit: Payer: Self-pay | Admitting: Family Medicine

## 2024-05-25 VITALS — BP 149/104 | HR 103 | Temp 98.0°F | Ht 69.0 in | Wt 274.2 lb

## 2024-05-25 DIAGNOSIS — G5601 Carpal tunnel syndrome, right upper limb: Secondary | ICD-10-CM

## 2024-05-25 DIAGNOSIS — M545 Low back pain, unspecified: Secondary | ICD-10-CM

## 2024-05-25 DIAGNOSIS — E1159 Type 2 diabetes mellitus with other circulatory complications: Secondary | ICD-10-CM

## 2024-05-25 DIAGNOSIS — E1165 Type 2 diabetes mellitus with hyperglycemia: Secondary | ICD-10-CM

## 2024-05-25 LAB — POCT GLYCOSYLATED HEMOGLOBIN (HGB A1C): Hemoglobin A1C: 6.7 % — AB (ref 4.0–5.6)

## 2024-05-25 MED ORDER — OZEMPIC (0.25 OR 0.5 MG/DOSE) 2 MG/3ML ~~LOC~~ SOPN: 0.5000 mg | PEN_INJECTOR | SUBCUTANEOUS | 0 refills | Status: AC

## 2024-05-25 MED ORDER — LISINOPRIL 20 MG PO TABS: 20.0000 mg | ORAL_TABLET | Freq: Every day | ORAL | 3 refills | Status: AC

## 2024-05-25 MED ORDER — CELECOXIB 200 MG PO CAPS: 200.0000 mg | ORAL_CAPSULE | Freq: Two times a day (BID) | ORAL | 3 refills | Status: AC | PRN

## 2024-05-25 MED ORDER — PREDNISONE 50 MG PO TABS: ORAL_TABLET | ORAL | 0 refills | Status: AC

## 2024-05-25 NOTE — Patient Instructions (Addendum)
 It was very nice to see you today!  VISIT SUMMARY: Today, we discussed your blood pressure and diabetes management, as well as your symptoms of peripheral neuropathy, carpal tunnel syndrome, and lower back pain. We made adjustments to your medications and provided recommendations to help manage your symptoms.  YOUR PLAN: TYPE 2 DIABETES MELLITUS: You have diabetes and recently stopped taking tirzepatide  due to severe nausea. Your A1c is currently 6.7%. -We have started you on Ozempic at a lower dose to avoid gastrointestinal side effects. We will gradually increase the dose as tolerated. -Please monitor your blood glucose levels regularly.  CARPAL TUNNEL SYNDROME: You have numbness in your thumb, index, and middle fingers, likely due to carpal tunnel syndrome. -Take prednisone  50 mg for 5 days to reduce inflammation. -Use a carpal tunnel brace with a bean bag for support at night. -If your symptoms do not improve, we may refer you to an orthopedic specialist.  LOW BACK PAIN: You have been experiencing lower back pain for about a month, which may be related to weight gain and carpal tunnel syndrome. -Take prednisone  50 mg for 5 days to reduce inflammation. -Take celecoxib 200 mg twice daily as needed for pain. Do not take it at the same time as prednisone .  HYPERTENSION: Your blood pressure is elevated, possibly due to weight gain and pain. -We have increased your lisinopril  dosage to better control your blood pressure. -Please monitor your blood pressure at home regularly.  No follow-ups on file.   Take care, Arvella Hummer, MD, MS   PLEASE NOTE:  If you had any lab tests, please let us  know if you have not heard back within a few days. You may see your results on mychart before we have a chance to review them but we will give you a call once they are reviewed by us .   If we ordered any referrals today, please let us  know if you have not heard from their office within the next week.    If you had any urgent prescriptions sent in today, please check with the pharmacy within an hour of our visit to make sure the prescription was transmitted appropriately.   Please try these tips to maintain a healthy lifestyle:  Eat at least 3 REAL meals and 1-2 snacks per day.  Aim for no more than 5 hours between eating.  If you eat breakfast, please do so within one hour of getting up.   Each meal should contain half fruits/vegetables, one quarter protein, and one quarter carbs (no bigger than a computer mouse)  Cut down on sweet beverages. This includes juice, soda, and sweet tea.   Drink at least 1 glass of water with each meal and aim for at least 8 glasses per day  Exercise at least 150 minutes every week.

## 2024-05-25 NOTE — Progress Notes (Signed)
 Assessment & Plan   Assessment/Plan:  Assessment and Plan Assessment & Plan Type 2 diabetes mellitus Recent discontinuation of tirzepatide  due to significant nausea and inability to work. Previous doses of 7.5 mg did not cause nausea, but 10 mg and 12 mg doses were intolerable. A1c is currently 6.7%. - Initiated Ozempic  0.5 mg SQ per week - Monitor blood glucose levels. - Monitor GI side effects  Carpal tunnel syndrome of right upper extremity  Numbness in the thumb, index, and middle fingers for about a month, likely exacerbated by weight gain and repetitive motion. Symptoms are more pronounced at night. - Prescribed prednisone  50 mg for 5 days to reduce inflammation. - Recommended nighttime use of a carpal tunnel brace with a bean bag for support. - Will consider referral to orthopedics if symptoms do not improve with conservative management.  Low back pain Approximately one month, coinciding with the cessation of tirzepatide . Pain may be related to weight gain and possibly exacerbated by carpal tunnel syndrome. - Prescribed prednisone  50 mg for 5 days to reduce inflammation. - Prescribed celecoxib  200 mg twice daily as needed for pain, avoiding concurrent use with prednisone .  Hypertension Elevated blood pressure, possibly related to weight gain and pain. Currently on lisinopril  10 mg. - Increased lisinopril  to 20 mg daily to better control blood pressure. - Monitor blood pressure at home.     Medications Discontinued During This Encounter  Medication Reason   tirzepatide  (MOUNJARO ) 12.5 MG/0.5ML Pen    lisinopril  (ZESTRIL ) 10 MG tablet     Return in about 3 months (around 08/23/2024).        Subjective:   Encounter date: 05/25/2024  Henry Sanchez is a 48 y.o. male who has GERD (gastroesophageal reflux disease); Bell palsy; Dyslipidemia associated with type 2 diabetes mellitus (HCC); Hypertension associated with diabetes (HCC); Class 3 severe obesity with serious  comorbidity in adult [facility]); Left foot pain; and Primary insomnia on their problem list..   He  has a past medical history of Diabetes mellitus without complication (HCC) and Hypertension.SABRA   He presents with chief complaint of Medical Management of Chronic Issues (Pt presents today for 3 mon f/u for his DM And BP. No questions or concerns. Pt is not fasting. Pt has not had his diabetic eye exam yet /) .   Discussed the use of AI scribe software for clinical note transcription with the patient, who gave verbal consent to proceed.  History of Present Illness Henry Sanchez is a 48 year old male with hypertension and diabetes who presents for follow-up on blood pressure and diabetes management.  Antihyperglycemic medication intolerance - Experiences significant nausea with tirzepatide , particularly at higher doses - 12.5 mg dose of tirzepatide  caused severe nausea resulting in three days of missed work (last taken end of October) - 10 mg dose caused moderate nausea - 7.5 mg dose did not cause nausea - Has not taken tirzepatide  for approximately 1.5 months - Previously used Ozempic  without adverse effects but discontinued due to cost  Peripheral neuropathy symptoms - Numbness in three fingers (excluding the thumb) on one hand for approximately one month - Sensation described as similar to 'sitting on your hand so I can sleep' - Has not used a brace for hand symptoms  Lower back pain - Onset around the time tirzepatide  was discontinued - Pain radiates down the back, especially with stretching - Tylenol  used for pain relief without significant benefit  Hypertension management - Blood pressure remains elevated - Currently taking lisinopril  10  mg - Not regularly monitoring blood pressure at home - No chest pain, shortness of breath, or palpitations   ROS  No past surgical history on file.  Medications Ordered Prior to Encounter[1]  Family History  Problem Relation Age of Onset    Breast cancer Mother     Social History   Socioeconomic History   Marital status: Single    Spouse name: Not on file   Number of children: Not on file   Years of education: Not on file   Highest education level: Not on file  Occupational History   Not on file  Tobacco Use   Smoking status: Never    Passive exposure: Never   Smokeless tobacco: Never  Vaping Use   Vaping status: Never Used  Substance and Sexual Activity   Alcohol use: Never   Drug use: No   Sexual activity: Not Currently  Other Topics Concern   Not on file  Social History Narrative   Not on file   Social Drivers of Health   Tobacco Use: Low Risk (01/26/2024)   Patient History    Smoking Tobacco Use: Never    Smokeless Tobacco Use: Never    Passive Exposure: Never  Financial Resource Strain: Not on file  Food Insecurity: Not on file  Transportation Needs: Not on file  Physical Activity: Not on file  Stress: Not on file  Social Connections: Not on file  Intimate Partner Violence: Not on file  Depression (PHQ2-9): Low Risk (05/25/2024)   Depression (PHQ2-9)    PHQ-2 Score: 0  Alcohol Screen: Not on file  Housing: Not on file  Utilities: Not on file  Health Literacy: Not on file                                                                                                  Objective:  Physical Exam: BP (!) 149/104   Pulse (!) 103   Temp 98 F (36.7 C)   Ht 5' 9 (1.753 m)   Wt 274 lb 3.2 oz (124.4 kg)   SpO2 100%   BMI 40.49 kg/m   Wt Readings from Last 3 Encounters:  05/25/24 274 lb 3.2 oz (124.4 kg)  01/26/24 270 lb 3.2 oz (122.6 kg)  10/26/23 270 lb 12.8 oz (122.8 kg)   Physical Exam GENERAL: Alert, cooperative, well developed, no acute distress HEENT: Normocephalic, normal oropharynx, moist mucous membranes CHEST: Clear to auscultation bilaterally, No wheezes, rhonchi, or crackles CARDIOVASCULAR: Normal heart rate and rhythm, S1 and S2 normal without murmurs ABDOMEN: Soft,  non-tender, non-distended, without organomegaly, Normal bowel sounds EXTREMITIES: No cyanosis or edema NEUROLOGICAL: Cranial nerves grossly intact, Moves all extremities without gross motor or sensory deficit   Physical Exam           Physical Exam Constitutional:      Appearance: Normal appearance.  HENT:     Head: Normocephalic and atraumatic.     Right Ear: Hearing normal.     Left Ear: Hearing normal.     Nose: Nose normal.  Eyes:     General: No scleral icterus.  Right eye: No discharge.        Left eye: No discharge.     Extraocular Movements: Extraocular movements intact.  Cardiovascular:     Rate and Rhythm: Normal rate and regular rhythm.     Heart sounds: Normal heart sounds.  Pulmonary:     Effort: Pulmonary effort is normal.     Breath sounds: Normal breath sounds.  Abdominal:     Palpations: Abdomen is soft.     Tenderness: There is no abdominal tenderness.  Musculoskeletal:     Right hand: Normal range of motion. Normal strength. Decreased sensation of the median distribution.  Skin:    General: Skin is warm.     Findings: No rash.  Neurological:     General: No focal deficit present.     Mental Status: He is alert.     Cranial Nerves: No cranial nerve deficit.  Psychiatric:        Mood and Affect: Mood normal.        Behavior: Behavior normal.        Thought Content: Thought content normal.        Judgment: Judgment normal.     No results found.  Recent Results (from the past 2160 hours)  Cologuard     Status: None   Collection Time: 05/24/24  8:00 AM  Result Value Ref Range   COLOGUARD Negative Negative    Comment: The Cologuard (TM) test was performed on this specimen.  NEGATIVE TEST RESULT. A negative Cologuard result indicates a low likelihood that a colorectal cancer (CRC) or advanced adenoma (adenomatous polyps with more advanced pre-malignant features) is present. The chance that a person with a negative Cologuard test has a  colorectal cancer is less than 1 in 1500 (negative predictive value >99.9%) or has an advanced adenoma is less than 5.3% (negative predictive value 94.7%). These data are based on a prospective cross-sectional study of 10,000 individuals at average risk for colorectal cancer who were screened with both Cologuard and colonoscopy. (Imperiale T. et al, N Engl J Med 2014;370(14):1286-1297) The normal value (reference range) for this assay is negative.  COLOGUARD RE-SCREENING RECOMMENDATION: Periodic colorectal cancer screening is an important part of preventive healthcare for asymptomatic individuals at average risk for colorectal cancer. Following a negative Cologuard  result, the American Cancer Society and U.S. Multi-Society Task Force screening guidelines recommend a Cologuard re-screening interval of 3 years.  References: American Cancer Society Guideline for Colorectal Cancer Screening: https://www.cancer.org/cancer/colon-rectal-cancer/detection-diagnosis-staging/acs-recommendations.html.; Rex DK, Boland CR, Dominitz JK, Colorectal Cancer Screening: Recommendations for Physicians and Patients from the U.S. Multi-Society Task Force on Colorectal Cancer Screening , Am J Gastroenterology 2017; 112:1016-1030.  TEST DESCRIPTION: Composite algorithmic analysis of stool DNA-biomarkers with hemoglobin immunoassay.   Quantitative values of individual biomarkers are not reportable and are not associated with individual biomarker result reference ranges. Cologuard is intended for colorectal cancer screening of adults of either sex, 45 years or older, who are at average-risk for colorectal cancer (CRC). Cologuard has been approved for use by the U.S.  FDA. The performance of Cologuard was established in a cross sectional study of average-risk adults aged 37-84. Cologuard performance in patients ages 29 to 38 years was estimated by sub-group analysis of near-age groups. Colonoscopies performed for a positive result may  find as the most clinically significant lesion: colorectal cancer [4.0%], advanced adenoma (including sessile serrated polyps greater than or equal to 1cm diameter) [20%] or non- advanced adenoma [31%]; or no colorectal neoplasia [45%]. These estimates are  derived from a prospective cross-sectional screening study of 10,000 individuals at average risk for colorectal cancer who were screened with both Cologuard and colonoscopy. (Imperiale T. et al, LOISE [place] J Med 2014;370(14):1286-1297.) Cologuard may produce a false negative or false positive result (no colorectal cancer or precancerous polyp present at colonoscopy follow up). A negative Cologuard test result does not guarantee the absence of CRC or advanced adenoma  (pre-cancer). The current Cologuard screening interval is every 3 years. Science Writer and [place]. Therapist, [employer]). Cologuard performance data in a 10,000 patient pivotal study using colonoscopy as the reference method can be accessed at the following location: www.exactlabs.com/results. Additional description of the Cologuard test process, warnings and precautions can be found at www.cologuard.com.   POCT HgB A1C     Status: Abnormal   Collection Time: 05/25/24  4:17 PM  Result Value Ref Range   Hemoglobin A1C 6.7 (A) 4.0 - 5.6 %   HbA1c POC (<> result, manual entry)     HbA1c, POC (prediabetic range)     HbA1c, POC (controlled diabetic range)          Beverley KATHEE Hummer, MD  I,Emily Lagle,acting as a scribe for Beverley KATHEE Hummer, MD.,have documented all relevant documentation on the behalf of Beverley KATHEE Hummer, MD.  LILLETTE Beverley KATHEE Hummer, MD, have reviewed all documentation for this visit. The documentation on 05/25/2024 for the exam, diagnosis, procedures, and orders are all accurate and complete.     [1]  Current Outpatient Medications on File Prior to Visit  Medication Sig Dispense Refill   amLODipine  (NORVASC ) 10 MG tablet Take 1 tablet (10 mg total) by mouth  daily. 90 tablet 3   metoCLOPramide  (REGLAN ) 10 MG tablet Take 1 tablet (10 mg total) by mouth every 6 (six) hours as needed for nausea, vomiting or refractory nausea / vomiting. 60 tablet 3   pantoprazole  (PROTONIX ) 40 MG tablet Take 1 tablet (40 mg total) by mouth daily as needed for heartburn or indigestion. 90 tablet 3   rosuvastatin  (CRESTOR ) 20 MG tablet Take 1 tablet (20 mg total) by mouth daily. 90 tablet 3   traZODone  (DESYREL ) 50 MG tablet TAKE 1/2 TO 1 (ONE-HALF TO ONE) TABLET BY MOUTH AT BEDTIME AS NEEDED FOR SLEEP 90 tablet 3   valACYclovir  (VALTREX ) 1000 MG tablet Take 1 tablet (1,000 mg total) by mouth 3 (three) times daily. 20 tablet 0   No current facility-administered medications on file prior to visit.

## 2024-05-30 ENCOUNTER — Ambulatory Visit: Payer: Self-pay | Admitting: Family Medicine

## 2024-05-30 LAB — COLOGUARD: COLOGUARD: NEGATIVE

## 2024-05-31 NOTE — Telephone Encounter (Signed)
 Normal result given to pt. Not sure why he was left a message regarding a bad result. Pt scheduled for acute visit with S. Wolfe on Monday for a new problem that started this morning. No questions.

## 2024-05-31 NOTE — Telephone Encounter (Signed)
 Copied from CRM #[id number]. Topic: Clinical - Lab/Test Results >> May 31, 2024  1:22 PM Berneda FALCON wrote: Reason for CRM: Patient states he was informed that the cologuard results were bad and he wants to talk to PCP about it please. He does not want to speak to the nurse, only the Dr. Please.  Patient callback is [phone] (home)

## 2024-06-01 ENCOUNTER — Emergency Department (HOSPITAL_BASED_OUTPATIENT_CLINIC_OR_DEPARTMENT_OTHER)
Admission: EM | Admit: 2024-06-01 | Discharge: 2024-06-01 | Disposition: A | Discharge status: Home / Self Care | Payer: PRIVATE HEALTH INSURANCE | Attending: Emergency Medicine | Admitting: Emergency Medicine

## 2024-06-01 ENCOUNTER — Encounter (HOSPITAL_BASED_OUTPATIENT_CLINIC_OR_DEPARTMENT_OTHER): Payer: Self-pay | Admitting: Emergency Medicine

## 2024-06-01 ENCOUNTER — Other Ambulatory Visit: Payer: Self-pay

## 2024-06-01 DIAGNOSIS — E119 Type 2 diabetes mellitus without complications: Secondary | ICD-10-CM | POA: Diagnosis not present

## 2024-06-01 DIAGNOSIS — N489 Disorder of penis, unspecified: Secondary | ICD-10-CM | POA: Diagnosis present

## 2024-06-01 LAB — URINALYSIS, ROUTINE W REFLEX MICROSCOPIC
Bilirubin Urine: NEGATIVE
Glucose, UA: NEGATIVE mg/dL
Ketones, ur: 15 mg/dL — AB
Leukocytes,Ua: NEGATIVE
Nitrite: NEGATIVE
Protein, ur: NEGATIVE mg/dL
Specific Gravity, Urine: 1.02 (ref 1.005–1.030)
pH: 6.5 (ref 5.0–8.0)

## 2024-06-01 LAB — URINALYSIS, MICROSCOPIC (REFLEX): WBC, UA: NONE SEEN WBC/hpf (ref 0–5)

## 2024-06-01 MED ORDER — LIDOCAINE HCL (PF) 1 % IJ SOLN
1.0000 mL | Freq: Once | INTRAMUSCULAR | Status: AC
  Administered 2024-06-01: 1 mL
  Filled 2024-06-01: qty 5

## 2024-06-01 MED ORDER — DOXYCYCLINE HYCLATE 100 MG PO TABS: 100.0000 mg | ORAL_TABLET | Freq: Two times a day (BID) | ORAL | 0 refills | Status: AC

## 2024-06-01 MED ORDER — CEFTRIAXONE SODIUM 500 MG IJ SOLR
500.0000 mg | Freq: Once | INTRAMUSCULAR | Status: AC
  Administered 2024-06-01: 500 mg via INTRAMUSCULAR
  Filled 2024-06-01: qty 500

## 2024-06-01 NOTE — ED Triage Notes (Signed)
 Pt in with reported penile swelling; pt states this morning he noticed 2 bumps at the head of his penis, swelling and pain has increased through the day. Reports a new sexual partner 2 wks ago. No discharge or pain while urinating reported

## 2024-06-01 NOTE — ED Provider Notes (Cosign Needed)
 " [place] [facility] Provider Note   CSN: [id number] Arrival date & time: 06/01/24  1924     Patient presents with: Groin Swelling and SEXUALLY TRANSMITTED DISEASE   Henry Sanchez is a 48 y.o. male.   Patient complains of having to sores on his penis.  Patient reports he has had swelling.  Patient reports he had a new partner 2 weeks ago and had unprotected sex.  Patient denies any current discharge he states that the sores are not painful.  Patient denies any fever or chills he denies any nausea or vomiting.  Patient reports he does have high blood pressure and diabetes.   The history is provided by the patient. No language interpreter was used.       Prior to Admission medications  Medication Sig Start Date End Date Taking? Authorizing Provider  doxycycline  (VIBRA -TABS) 100 MG tablet Take 1 tablet (100 mg total) by mouth 2 (two) times daily for 7 days. 06/01/24 06/08/24 Yes Flint Raring K, PA-C  amLODipine  (NORVASC ) 10 MG tablet Take 1 tablet (10 mg total) by mouth daily. 01/26/24 01/20/25  Sebastian Beverley NOVAK, MD  celecoxib  (CELEBREX ) 200 MG capsule Take 1 capsule (200 mg total) by mouth 2 (two) times daily as needed (pain). 05/25/24 05/20/25  Sebastian Beverley NOVAK, MD  lisinopril  (ZESTRIL ) 20 MG tablet Take 1 tablet (20 mg total) by mouth daily. 05/25/24 05/20/25  Sebastian Beverley NOVAK, MD  metoCLOPramide  (REGLAN ) 10 MG tablet Take 1 tablet (10 mg total) by mouth every 6 (six) hours as needed for nausea, vomiting or refractory nausea / vomiting. 01/26/24 01/20/25  Sebastian Beverley NOVAK, MD  pantoprazole  (PROTONIX ) 40 MG tablet Take 1 tablet (40 mg total) by mouth daily as needed for heartburn or indigestion. 10/26/23 10/20/24  Sebastian Beverley NOVAK, MD  predniSONE  (DELTASONE ) 50 MG tablet Take 1 tablet daily for 5 days. 05/25/24   Sebastian Beverley NOVAK, MD  rosuvastatin  (CRESTOR ) 20 MG tablet Take 1 tablet (20 mg total) by mouth daily. 01/31/24 01/25/25  Sebastian Beverley NOVAK, MD   Semaglutide ,0.25 or 0.5MG /DOS, (OZEMPIC , 0.25 OR 0.5 MG/DOSE,) 2 MG/3ML SOPN Inject 0.5 mg into the skin once a week. 05/25/24 08/23/24  Sebastian Beverley NOVAK, MD  traZODone  (DESYREL ) 50 MG tablet TAKE 1/2 TO 1 (ONE-HALF TO ONE) TABLET BY MOUTH AT BEDTIME AS NEEDED FOR SLEEP 01/26/24   Sebastian Beverley NOVAK, MD  valACYclovir  (VALTREX ) 1000 MG tablet Take 1 tablet (1,000 mg total) by mouth 3 (three) times daily. 11/26/18   Freddi Hamilton, MD    Allergies: Patient has no known allergies.    Review of Systems  Genitourinary:  Positive for genital sores.  All other systems reviewed and are negative.   Updated Vital Signs BP 124/86   Pulse 91   Temp 98 F (36.7 C) (Oral)   Resp 18   Wt 124.4 kg   SpO2 96%   BMI 40.49 kg/m   Physical Exam Vitals reviewed.  Constitutional:      Appearance: Normal appearance.  Cardiovascular:     Rate and Rhythm: Normal rate.  Pulmonary:     Effort: Pulmonary effort is normal.  Genitourinary:    Comments: 5 mm scabbed lesion left side of penile shaft, 3 mm lesion below left glans that has a pink color and a small hole in the center.  Neither lesion is painful.  Small amount of white discharge noted Musculoskeletal:        General: Normal range of motion.  Skin:    General: Skin is warm.  Neurological:     General: No focal deficit present.     Mental Status: He is alert. Mental status is at baseline.     (all labs ordered are listed, but only abnormal results are displayed) Labs Reviewed  URINALYSIS, ROUTINE W REFLEX MICROSCOPIC - Abnormal; Notable for the following components:      Result Value   APPearance HAZY (*)    Hgb urine dipstick TRACE (*)    Ketones, ur 15 (*)    All other components within normal limits  URINALYSIS, MICROSCOPIC (REFLEX) - Abnormal; Notable for the following components:   Bacteria, UA RARE (*)    All other components within normal limits  HSV CULTURE AND TYPING  HIV ANTIBODY (ROUTINE TESTING W REFLEX)  SYPHILIS: RPR  W/REFLEX TO RPR TITER AND TREPONEMAL ANTIBODIES, TRADITIONAL SCREENING AND DIAGNOSIS ALGORITHM  GC/CHLAMYDIA PROBE AMP ([facility]) NOT AT [facility]    EKG: None  Radiology: No results found.   Procedures   Medications Ordered in the ED  cefTRIAXone (ROCEPHIN) injection 500 mg (500 mg Intramuscular Given 06/01/24 2124)  lidocaine  (PF) (XYLOCAINE ) 1 % injection 1 mL (1 mL Other Given 06/01/24 2124)                                    Medical Decision Making Patient complains of 2 penile lesions after having unprotected sex with a new partner  Amount and/or Complexity of Data Reviewed Labs: ordered. Decision-making details documented in ED Course.    Details: Labs ordered reviewed and interpreted.  RPR HIV GC chlamydia and herpes testing are ordered.  Risk Prescription drug management. Risk Details: Patient is given an injection of Rocephin he is started on doxycycline .  I suspect lesions are herpetic however because they are not painful a RPR is ordered.  Patient is advised he may need further treatment and further evaluation.  Patient is advised to follow-up with his primary care physician for recheck.  Patient is discharged in stable condition        Final diagnoses:  Penile lesion    ED Discharge Orders          Ordered    doxycycline  (VIBRA -TABS) 100 MG tablet  2 times daily        06/01/24 2054            An After Visit Summary was printed and given to the patient.    Flint Sonny POUR, PA-C 06/01/24 2254  "

## 2024-06-01 NOTE — Discharge Instructions (Addendum)
 Test are pending for gonorrhea, chlamydia HIV syphilis and herpes.  Take the antibiotic as prescribed.  You may require further treatment if any test are positive.

## 2024-06-02 LAB — HIV ANTIBODY (ROUTINE TESTING W REFLEX): HIV Screen 4th Generation wRfx: NONREACTIVE

## 2024-06-03 LAB — SYPHILIS: RPR W/REFLEX TO RPR TITER AND TREPONEMAL ANTIBODIES, TRADITIONAL SCREENING AND DIAGNOSIS ALGORITHM: RPR Ser Ql: NONREACTIVE

## 2024-06-04 ENCOUNTER — Ambulatory Visit: Admitting: Emergency Medicine

## 2024-06-04 LAB — GC/CHLAMYDIA PROBE AMP (~~LOC~~) NOT AT ARMC
Chlamydia: NEGATIVE
Comment: NEGATIVE
Comment: NORMAL
Neisseria Gonorrhea: NEGATIVE

## 2024-06-05 LAB — HSV CULTURE AND TYPING

## 2024-06-26 ENCOUNTER — Encounter (HOSPITAL_BASED_OUTPATIENT_CLINIC_OR_DEPARTMENT_OTHER): Payer: Self-pay

## 2024-06-26 ENCOUNTER — Emergency Department (HOSPITAL_BASED_OUTPATIENT_CLINIC_OR_DEPARTMENT_OTHER)
Admission: EM | Admit: 2024-06-26 | Discharge: 2024-06-26 | Disposition: A | Discharge status: Home / Self Care | Payer: PRIVATE HEALTH INSURANCE | Attending: Emergency Medicine | Admitting: Emergency Medicine

## 2024-06-26 ENCOUNTER — Other Ambulatory Visit: Payer: Self-pay

## 2024-06-26 DIAGNOSIS — R519 Headache, unspecified: Secondary | ICD-10-CM | POA: Diagnosis not present

## 2024-06-26 DIAGNOSIS — R197 Diarrhea, unspecified: Secondary | ICD-10-CM | POA: Diagnosis not present

## 2024-06-26 DIAGNOSIS — E119 Type 2 diabetes mellitus without complications: Secondary | ICD-10-CM | POA: Insufficient documentation

## 2024-06-26 DIAGNOSIS — I10 Essential (primary) hypertension: Secondary | ICD-10-CM | POA: Diagnosis not present

## 2024-06-26 DIAGNOSIS — R112 Nausea with vomiting, unspecified: Secondary | ICD-10-CM | POA: Diagnosis not present

## 2024-06-26 DIAGNOSIS — R1110 Vomiting, unspecified: Secondary | ICD-10-CM | POA: Diagnosis present

## 2024-06-26 DIAGNOSIS — R1084 Generalized abdominal pain: Secondary | ICD-10-CM | POA: Diagnosis not present

## 2024-06-26 LAB — COMPREHENSIVE METABOLIC PANEL WITH GFR
ALT: 27 U/L (ref 0–44)
AST: 22 U/L (ref 15–41)
Albumin: 4.4 g/dL (ref 3.5–5.0)
Alkaline Phosphatase: 72 U/L (ref 38–126)
Anion gap: 13 (ref 5–15)
BUN: 14 mg/dL (ref 6–20)
CO2: 25 mmol/L (ref 22–32)
Calcium: 9.4 mg/dL (ref 8.9–10.3)
Chloride: 102 mmol/L (ref 98–111)
Creatinine, Ser: 1.12 mg/dL (ref 0.61–1.24)
GFR, Estimated: >60 mL/min
Glucose, Bld: 111 mg/dL — ABNORMAL HIGH (ref 70–99)
Potassium: 3.8 mmol/L (ref 3.5–5.1)
Sodium: 139 mmol/L (ref 135–145)
Total Bilirubin: 0.6 mg/dL (ref 0.0–1.2)
Total Protein: 7.6 g/dL (ref 6.5–8.1)

## 2024-06-26 LAB — CBC
HCT: 45.1 % (ref 39.0–52.0)
Hemoglobin: 15.2 g/dL (ref 13.0–17.0)
MCH: 28.5 pg (ref 26.0–34.0)
MCHC: 33.7 g/dL (ref 30.0–36.0)
MCV: 84.6 fL (ref 80.0–100.0)
Platelets: 223 K/uL (ref 150–400)
RBC: 5.33 MIL/uL (ref 4.22–5.81)
RDW: 14.2 % (ref 11.5–15.5)
WBC: 8.3 K/uL (ref 4.0–10.5)
nRBC: 0 % (ref 0.0–0.2)

## 2024-06-26 LAB — URINALYSIS, ROUTINE W REFLEX MICROSCOPIC
Glucose, UA: NEGATIVE mg/dL
Hgb urine dipstick: NEGATIVE
Ketones, ur: 40 mg/dL — AB
Leukocytes,Ua: NEGATIVE
Nitrite: NEGATIVE
Protein, ur: 30 mg/dL — AB
Specific Gravity, Urine: 1.025 (ref 1.005–1.030)
pH: 6 (ref 5.0–8.0)

## 2024-06-26 LAB — RESP PANEL BY RT-PCR (RSV, FLU A&B, COVID)  RVPGX2
Influenza A by PCR: NEGATIVE
Influenza B by PCR: NEGATIVE
Resp Syncytial Virus by PCR: NEGATIVE
SARS Coronavirus 2 by RT PCR: NEGATIVE

## 2024-06-26 LAB — URINALYSIS, MICROSCOPIC (REFLEX)

## 2024-06-26 LAB — LIPASE, BLOOD: Lipase: 26 U/L (ref 11–51)

## 2024-06-26 MED ORDER — ONDANSETRON HCL 4 MG/2ML IJ SOLN
4.0000 mg | Freq: Once | INTRAMUSCULAR | Status: AC
  Administered 2024-06-26: 4 mg via INTRAVENOUS
  Filled 2024-06-26: qty 2

## 2024-06-26 MED ORDER — DICYCLOMINE HCL 10 MG/ML IM SOLN
20.0000 mg | Freq: Once | INTRAMUSCULAR | Status: AC
  Administered 2024-06-26: 20 mg via INTRAMUSCULAR
  Filled 2024-06-26: qty 2

## 2024-06-26 MED ORDER — LOPERAMIDE HCL 2 MG PO CAPS: 2.0000 mg | ORAL_CAPSULE | Freq: Four times a day (QID) | ORAL | 0 refills | Status: AC | PRN

## 2024-06-26 MED ORDER — PANTOPRAZOLE SODIUM 40 MG IV SOLR
40.0000 mg | Freq: Once | INTRAVENOUS | Status: AC
  Administered 2024-06-26: 40 mg via INTRAVENOUS
  Filled 2024-06-26: qty 10

## 2024-06-26 MED ORDER — ONDANSETRON 4 MG PO TBDP: 4.0000 mg | ORAL_TABLET | Freq: Three times a day (TID) | ORAL | 0 refills | Status: AC | PRN

## 2024-06-26 MED ORDER — DICYCLOMINE HCL 20 MG PO TABS: 20.0000 mg | ORAL_TABLET | Freq: Three times a day (TID) | ORAL | 0 refills | Status: AC | PRN

## 2024-06-26 MED ORDER — SODIUM CHLORIDE 0.9 % IV BOLUS
1000.0000 mL | Freq: Once | INTRAVENOUS | Status: AC
  Administered 2024-06-26: 1000 mL via INTRAVENOUS

## 2024-06-26 NOTE — Discharge Instructions (Signed)
You have been seen in the Emergency Department (ED) today for nausea and vomiting.  Your work up today has not shown a clear cause for your symptoms. °You have been prescribed Zofran; please use as prescribed as needed for your nausea. ° °Follow up with your doctor as soon as possible regarding today?s emergent visit and your symptoms of nausea.  ° °Return to the ED if you develop abdominal, bloody vomiting, bloody diarrhea, if you are unable to tolerate fluids due to vomiting, or if you develop other symptoms that concern you. ° °

## 2024-06-26 NOTE — ED Notes (Signed)
 Pt unable to provide UA at this time. Pt given cup, will continue to encourage.

## 2024-06-26 NOTE — ED Triage Notes (Signed)
 Pt reports headache, body aches, abd pain, sore throat, vomiting and diarrhea; onset yesterday. He reports he has not taken any OTC meds for his symptoms.

## 2024-06-26 NOTE — ED Provider Notes (Signed)
 "  Emergency Department Provider Note   I have reviewed the triage vital signs and the nursing notes.   HISTORY  Chief Complaint Emesis   HPI Henry Sanchez is a 49 y.o. male with past history of diabetes and hypertension presents the emergency department with headache, body aches, diffuse abdominal discomfort, vomiting/diarrhea.  Symptoms began yesterday.  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  He denies any chest pain or shortness of breath.  No cough or nasal congestion symptoms.  He has been trying to stay hydrated today but often vomiting.    Past Medical History:  Diagnosis Date   Diabetes mellitus without complication (HCC)    Hypertension     Review of Systems  Constitutional: No fever/chills Cardiovascular: Denies chest pain. Respiratory: Denies shortness of breath. Gastrointestinal: Positive diffuse abdominal pain. Positive nausea, vomiting, and diarrhea.  Skin: Negative for rash. Neurological: Positive HA.   ____________________________________________   PHYSICAL EXAM:  VITAL SIGNS: ED Triage Vitals  Encounter Vitals Group     BP 06/26/24 1943 118/78     Pulse Rate 06/26/24 1943 (!) 128     Resp 06/26/24 1943 19     Temp 06/26/24 1943 99.6 F (37.6 C)     Temp Source 06/26/24 1943 Oral     SpO2 06/26/24 1943 95 %     Weight 06/26/24 1943 270 lb (122.5 kg)     Height 06/26/24 1943 5' 9 (1.753 m)   Constitutional: Alert and oriented. Well appearing and in no acute distress. Eyes: Conjunctivae are normal.  Head: Atraumatic. Nose: No congestion/rhinnorhea. Mouth/Throat: Mucous membranes are moist. Neck: No stridor.   Cardiovascular: Normal rate, regular rhythm. Good peripheral circulation. Grossly normal heart sounds.   Respiratory: Normal respiratory effort.  No retractions. Lungs CTAB. Gastrointestinal: Soft and nontender. No distention.  Musculoskeletal: No lower extremity tenderness nor edema. No gross  deformities of extremities. Neurologic:  Normal speech and language. No gross focal neurologic deficits are appreciated.  Skin:  Skin is warm, dry and intact. No rash noted.   ____________________________________________   LABS (all labs ordered are listed, but only abnormal results are displayed)  Labs Reviewed  COMPREHENSIVE METABOLIC PANEL WITH GFR - Abnormal; Notable for the following components:      Result Value   Glucose, Bld 111 (*)    All other components within normal limits  URINALYSIS, ROUTINE W REFLEX MICROSCOPIC - Abnormal; Notable for the following components:   Bilirubin Urine SMALL (*)    Ketones, ur 40 (*)    Protein, ur 30 (*)    All other components within normal limits  URINALYSIS, MICROSCOPIC (REFLEX) - Abnormal; Notable for the following components:   Bacteria, UA RARE (*)    All other components within normal limits  RESP PANEL BY RT-PCR (RSV, FLU A&B, COVID)  RVPGX2  LIPASE, BLOOD  CBC   ____________________________________________   PROCEDURES  Procedure(s) performed:   Procedures  None  ____________________________________________   INITIAL IMPRESSION / ASSESSMENT AND PLAN / ED COURSE  Pertinent labs & imaging results that were available during my care of the patient were reviewed by me and considered in my medical decision making (see chart for details).   This patient is Presenting for Evaluation of abdominal pain, which does require a range of treatment options, and is a complaint that involves a high risk of morbidity and mortality.  The Differential Diagnoses includes but is not exclusive to acute cholecystitis, intrathoracic causes for epigastric abdominal  pain, gastritis, duodenitis, pancreatitis, small bowel or large bowel obstruction, abdominal aortic aneurysm, hernia, gastritis, etc.   Critical Interventions-    Medications  sodium chloride  0.9 % bolus 1,000 mL (0 mLs Intravenous Stopped 06/26/24 2231)  pantoprazole  (PROTONIX )  injection 40 mg (40 mg Intravenous Given 06/26/24 2131)  ondansetron  (ZOFRAN ) injection 4 mg (4 mg Intravenous Given 06/26/24 2131)  dicyclomine  (BENTYL ) injection 20 mg (20 mg Intramuscular Given 06/26/24 2135)    Reassessment after intervention: symptoms improved.   Clinical Laboratory Tests Ordered, included CBC without leukocytosis or anemia.  CMP shows normal LFTs and bilirubin.  Lipase normal.  COVID, flu, RSV PCR negative.  Cardiac Monitor Tracing which shows sinus tachycardia.   Medical Decision Making: Summary:  Patient presents emergency department with abdominal discomfort and vomiting with diarrhea.  Seems to be viral illness primarily.  CBC without leukocytosis.  Sinus tachycardia but improving with IV fluids.  Plan for symptom management primarily.  Abdomen is diffusely soft and nontender.  Do not plan on advanced abdominal imaging at this time.  Reevaluation with update and discussion with patient. Symptoms improved. Plan for symptom mgmt at home and PCP follow up plan. Work note provided.    Patient's presentation is most consistent with acute presentation with potential threat to life or bodily function.   Disposition: discharge  ____________________________________________  FINAL CLINICAL IMPRESSION(S) / ED DIAGNOSES  Final diagnoses:  Nausea vomiting and diarrhea  Generalized abdominal pain     NEW OUTPATIENT MEDICATIONS STARTED DURING THIS VISIT:  Discharge Medication List as of 06/26/2024 11:00 PM     START taking these medications   Details  dicyclomine  (BENTYL ) 20 MG tablet Take 1 tablet (20 mg total) by mouth 3 (three) times daily as needed., Starting Tue 06/26/2024, Normal    loperamide  (IMODIUM ) 2 MG capsule Take 1 capsule (2 mg total) by mouth 4 (four) times daily as needed for diarrhea or loose stools., Starting Tue 06/26/2024, Normal    ondansetron  (ZOFRAN -ODT) 4 MG disintegrating tablet Take 1 tablet (4 mg total) by mouth every 8 (eight) hours as  needed., Starting Tue 06/26/2024, Normal        Note:  This document was prepared using Dragon voice recognition software and may include unintentional dictation errors.  Fonda Law, MD, [facility]    Adelayde Minney, Fonda MATSU, MD 07/02/24 [phone]  "

## 2024-07-09 ENCOUNTER — Encounter: Payer: Self-pay | Admitting: Family Medicine

## 2024-08-24 ENCOUNTER — Ambulatory Visit: Admitting: Family Medicine
